# Patient Record
Sex: Female | Born: 1960 | Race: White | Hispanic: No | Marital: Married | Smoking: Never smoker
Health system: Southern US, Community
[De-identification: ages and names within clinical notes are randomized; demographics above are authoritative.]

## PROBLEM LIST (undated history)

## (undated) DIAGNOSIS — G2581 Restless legs syndrome: Secondary | ICD-10-CM

## (undated) DIAGNOSIS — G4700 Insomnia, unspecified: Secondary | ICD-10-CM

## (undated) DIAGNOSIS — E785 Hyperlipidemia, unspecified: Secondary | ICD-10-CM

## (undated) DIAGNOSIS — K219 Gastro-esophageal reflux disease without esophagitis: Secondary | ICD-10-CM

## (undated) DIAGNOSIS — E119 Type 2 diabetes mellitus without complications: Secondary | ICD-10-CM

## (undated) HISTORY — DX: Gastro-esophageal reflux disease without esophagitis: K21.9

## (undated) HISTORY — PX: APPENDECTOMY: SHX54

## (undated) HISTORY — DX: Insomnia, unspecified: G47.00

## (undated) HISTORY — DX: Type 2 diabetes mellitus without complications: E11.9

## (undated) HISTORY — DX: Hyperlipidemia, unspecified: E78.5

## (undated) HISTORY — DX: Restless legs syndrome: G25.81

---

## 1989-02-07 HISTORY — PX: TUBAL LIGATION: SHX77

## 1990-02-07 HISTORY — PX: ECTOPIC PREGNANCY SURGERY: SHX613

## 1997-02-07 HISTORY — PX: ABDOMINAL HYSTERECTOMY: SHX81

## 2002-02-07 HISTORY — PX: OTHER SURGICAL HISTORY: SHX169

## 2012-02-08 LAB — HM COLONOSCOPY

## 2016-04-18 ENCOUNTER — Encounter: Payer: Self-pay | Admitting: Family

## 2016-04-18 ENCOUNTER — Other Ambulatory Visit: Payer: Self-pay | Admitting: Family

## 2016-04-18 ENCOUNTER — Ambulatory Visit (INDEPENDENT_AMBULATORY_CARE_PROVIDER_SITE_OTHER): Payer: BC Managed Care – PPO | Admitting: Family

## 2016-04-18 VITALS — BP 136/87 | HR 92 | Temp 98.4°F | Resp 16 | Ht 63.5 in | Wt 235.4 lb

## 2016-04-18 DIAGNOSIS — Z111 Encounter for screening for respiratory tuberculosis: Secondary | ICD-10-CM | POA: Diagnosis not present

## 2016-04-18 DIAGNOSIS — E785 Hyperlipidemia, unspecified: Secondary | ICD-10-CM

## 2016-04-18 DIAGNOSIS — E118 Type 2 diabetes mellitus with unspecified complications: Secondary | ICD-10-CM | POA: Diagnosis not present

## 2016-04-18 DIAGNOSIS — Z794 Long term (current) use of insulin: Secondary | ICD-10-CM

## 2016-04-18 DIAGNOSIS — G4700 Insomnia, unspecified: Secondary | ICD-10-CM | POA: Insufficient documentation

## 2016-04-18 DIAGNOSIS — F32A Depression, unspecified: Secondary | ICD-10-CM | POA: Insufficient documentation

## 2016-04-18 DIAGNOSIS — E119 Type 2 diabetes mellitus without complications: Secondary | ICD-10-CM | POA: Diagnosis not present

## 2016-04-18 DIAGNOSIS — N3010 Interstitial cystitis (chronic) without hematuria: Secondary | ICD-10-CM

## 2016-04-18 DIAGNOSIS — E669 Obesity, unspecified: Secondary | ICD-10-CM | POA: Insufficient documentation

## 2016-04-18 DIAGNOSIS — F329 Major depressive disorder, single episode, unspecified: Secondary | ICD-10-CM

## 2016-04-18 DIAGNOSIS — R809 Proteinuria, unspecified: Secondary | ICD-10-CM | POA: Insufficient documentation

## 2016-04-18 DIAGNOSIS — E1169 Type 2 diabetes mellitus with other specified complication: Secondary | ICD-10-CM | POA: Insufficient documentation

## 2016-04-18 LAB — BASIC METABOLIC PANEL
BUN: 13 mg/dL (ref 6–23)
CALCIUM: 9.3 mg/dL (ref 8.4–10.5)
CHLORIDE: 105 meq/L (ref 96–112)
CO2: 29 mEq/L (ref 19–32)
CREATININE: 0.61 mg/dL (ref 0.40–1.20)
GFR: 108.44 mL/min (ref >60.00)
Glucose, Bld: 182 mg/dL — ABNORMAL HIGH (ref 70–99)
Potassium: 3.8 mEq/L (ref 3.5–5.1)
Sodium: 141 mEq/L (ref 135–145)

## 2016-04-18 LAB — HEMOGLOBIN A1C: HEMOGLOBIN A1C: 7.5 % — AB (ref 4.6–6.5)

## 2016-04-18 LAB — HEPATIC FUNCTION PANEL
ALT: 16 U/L (ref 0–35)
AST: 12 U/L (ref 0–37)
Albumin: 3.9 g/dL (ref 3.5–5.2)
Alkaline Phosphatase: 67 U/L (ref 39–117)
BILIRUBIN DIRECT: 0.1 mg/dL (ref 0.0–0.3)
BILIRUBIN TOTAL: 0.7 mg/dL (ref 0.2–1.2)
Total Protein: 7 g/dL (ref 6.0–8.3)

## 2016-04-18 LAB — MICROALBUMIN / CREATININE URINE RATIO
CREATININE, U: 107 mg/dL
MICROALB UR: 2.2 mg/dL — AB (ref 0.0–1.9)
MICROALB/CREAT RATIO: 2.1 mg/g (ref 0.0–30.0)

## 2016-04-18 LAB — LIPID PANEL
CHOLESTEROL: 213 mg/dL — AB (ref 0–200)
HDL: 51.5 mg/dL (ref >39.00)
LDL Cholesterol: 132 mg/dL — ABNORMAL HIGH (ref 0–99)
NonHDL: 161.05
TRIGLYCERIDES: 144 mg/dL (ref 0.0–149.0)
Total CHOL/HDL Ratio: 4
VLDL: 28.8 mg/dL (ref 0.0–40.0)

## 2016-04-18 MED ORDER — INSULIN DETEMIR 100 UNIT/ML FLEXPEN: 20.0000 [IU] | PEN_INJECTOR | SUBCUTANEOUS | 5 refills | Status: DC

## 2016-04-18 MED ORDER — ZALEPLON 10 MG PO CAPS: 10.0000 mg | ORAL_CAPSULE | Freq: Every evening | ORAL | 0 refills | Status: DC | PRN

## 2016-04-18 MED ORDER — INSULIN DETEMIR 100 UNIT/ML FLEXPEN: 23.0000 [IU] | PEN_INJECTOR | SUBCUTANEOUS | 5 refills | Status: DC

## 2016-04-18 MED ORDER — ASPIRIN 81 MG PO TABS: 81.0000 mg | ORAL_TABLET | Freq: Every day | ORAL | Status: DC

## 2016-04-18 MED ORDER — LISINOPRIL 5 MG PO TABS: 5.0000 mg | ORAL_TABLET | Freq: Every day | ORAL | 3 refills | Status: DC

## 2016-04-18 NOTE — Assessment & Plan Note (Signed)
Clinically stable. Consider referral to urology if recurrent symptoms.

## 2016-04-18 NOTE — Assessment & Plan Note (Signed)
Stable.  Not currently on medications.  Monitor.

## 2016-04-18 NOTE — Progress Notes (Signed)
Subjective:    Patient ID: Lisa [name], female    DOB: 25-Jan-1961, 56 y.o.   MRN: [id number]  HPI   Lisa [name] is a 56 yr old female who presents today to establish care.  She recently relocated from [place] for her husband's job.   pmhx is significant for the following:  DM2- maintained on levemir. She was first diagnosed 15 years ago.  She has been on insulin for 5 years. Last A1C in November was 7.0.  Reports that metformin caused GI issues so this was discontinued.  Reports that her diet is not good.  "I eat whatever."    Insomnia- reports that this has been an issue since she had her hysterectomy.  She reports that she takes sonata nightly.  She reports that she also uses melatonin otc sleep aids.  Reports that she has trouble falling asleep because maintained on prn sonata. Reports that she has claustrophobia.  Has declined sleep studies.  + snoring.   Interstitial cystitis- has hx of OAB as well.  Reports OAB meds were not helpful.   Depression- reports that she has been treated with "depression medication" in the past.  Reports that about 9 years ago with hospitalization x 3 days.  At that time she was having issues with her work and her son.  She was suicidal with plan at that time.    Review of Systems  Constitutional:       Some weight gain in the last 6 months, due to diet.  Has gained 15 pounds  HENT: Negative for hearing loss and rhinorrhea.   Eyes: Negative for visual disturbance.  Respiratory: Negative for cough.   Cardiovascular: Negative for leg swelling.  Gastrointestinal: Negative for constipation and diarrhea.  Genitourinary: Positive for frequency.  Musculoskeletal: Negative for myalgias.       Reports some mild right hip pain which she attributes to her weight.    Skin: Negative for rash.  Neurological:       Rare headaches  Hematological: Negative for adenopathy.  Psychiatric/Behavioral:       Denies depression/anxiety   Past Medical History:  Diagnosis  Date  . Diabetes mellitus without complication (HCC)    type II  . Insomnia      Social History   Social History  . Marital status: Married    Spouse name: N/A  . Number of children: N/A  . Years of education: N/A   Occupational History  . Not on file.   Social History Main Topics  . Smoking status: Never Smoker  . Smokeless tobacco: Never Used  . Alcohol use Yes     Comment: special occasions "couple times a year"  . Drug use: No  . Sexual activity: Yes   Other Topics Concern  . Not on file   Social History Narrative   Works as a Systems developerspecial Ed Teacher for [employer]   Moved from [place], grew up in [place]   Married   Grown children (2 sons one in [place] he has 2 sons and a daughter, another son in [place] and has one child)    Past Surgical History:  Procedure Laterality Date  . ABDOMINAL HYSTERECTOMY  1999   adhesions  . APPENDECTOMY    . CESAREAN SECTION  1987  . ECTOPIC PREGNANCY SURGERY  1992  . OTHER SURGICAL HISTORY  2004   "bowel obstruction-- open wound"  . TUBAL LIGATION  1991    Family History  Problem Relation Age of Onset  .  Diabetes Mother     type II  . Hyperthyroidism Mother   . Diabetes Father     type II  . Diabetes Paternal Grandmother     No Known Allergies  No current outpatient prescriptions on file prior to visit.   No current facility-administered medications on file prior to visit.     BP 136/87 (BP Location: Right Arm, Cuff Size: Large)   Pulse 92   Temp 98.4 F (36.9 C) (Oral)   Resp 16   Ht 5' 3.5" (1.613 m)   Wt 235 lb 6.4 oz (106.8 kg)   SpO2 98% Comment: room air  BMI 41.05 kg/m       Objective:   Physical Exam  Constitutional: She is oriented to person, place, and time. She appears well-developed and well-nourished.  HENT:  Head: Normocephalic and atraumatic.  Cardiovascular: Normal rate, regular rhythm and normal heart sounds.   No murmur heard. Pulmonary/Chest: Effort normal and breath sounds normal.  No respiratory distress. She has no wheezes.  Musculoskeletal: She exhibits no edema.  Neurological: She is alert and oriented to person, place, and time.  Psychiatric: She has a normal mood and affect. Her behavior is normal. Judgment and thought content normal.          Assessment & Plan:  Add aspirin 81mg  once daily for cardiac protection.   Tdap today and obtain TB gold testing for her paperwork requirements for work.

## 2016-04-18 NOTE — Telephone Encounter (Signed)
Please contact patient and let her know that her A1C is above goal at 7.5.  I would recommend that she increase her levemir from 20 to 23 units once daily. Also, cholesterol above goal. Add lipitor once daily.  Urine microalbumin is elevated so restarting lisinopril as we discussed is important.

## 2016-04-18 NOTE — Progress Notes (Signed)
Pre visit review using our clinic review tool, if applicable. No additional management support is needed unless otherwise documented below in the visit note. 

## 2016-04-18 NOTE — Patient Instructions (Addendum)
Please complete lab work prior to leaving. Add lisinopril 5mg  once daily for kidney protection and aspirin 81mg  once daily for cardiac protection. Work on adding regular exercise and a healthier diet.

## 2016-04-18 NOTE — Assessment & Plan Note (Signed)
Continue sonata.  A controlled substance contract is signed and a UDS will be obtained. Refill provided.

## 2016-04-18 NOTE — Assessment & Plan Note (Signed)
Last A1C was 7. Obtain follow up A1C, urine microalbumin.  Add ace for renal protection and BP control.  BP goal <130/80.

## 2016-04-19 MED ORDER — INSULIN DETEMIR 100 UNIT/ML FLEXPEN: 23.0000 [IU] | PEN_INJECTOR | SUBCUTANEOUS | 5 refills | Status: DC

## 2016-04-19 MED ORDER — ATORVASTATIN CALCIUM 20 MG PO TABS: 20.0000 mg | ORAL_TABLET | Freq: Every day | ORAL | 3 refills | Status: DC

## 2016-04-19 NOTE — Telephone Encounter (Signed)
Notified pt and she voices understanding. Rx signed for atorvastatin and Rx sent with new insulin dose per pt's request.

## 2016-04-20 LAB — QUANTIFERON TB GOLD ASSAY (BLOOD)
Interferon Gamma Release Assay: NEGATIVE
MITOGEN-NIL SO: 6.45 [IU]/mL
Quantiferon Nil Value: 0.01 IU/mL
Quantiferon Tb Ag Minus Nil Value: 0 IU/mL

## 2016-04-25 ENCOUNTER — Telehealth: Payer: Self-pay | Admitting: *Deleted

## 2016-04-25 NOTE — Telephone Encounter (Signed)
Pt states insurance will be changing soon and Levemir will cost her $100. Pt is requesting different insulin be sent to pharmacy. Advised her to call her new insurance and find out preferred alternatives and let us know choices then we can send appropriate alternative. Pt voices understanding.

## 2016-04-25 NOTE — Telephone Encounter (Signed)
Health exam certificate was completed. Notified pt and she requests that we mail original form to her home address. Copy sent for scanning and original mailed.

## 2016-05-16 ENCOUNTER — Encounter: Payer: Self-pay | Admitting: Family

## 2016-05-16 ENCOUNTER — Ambulatory Visit (INDEPENDENT_AMBULATORY_CARE_PROVIDER_SITE_OTHER): Payer: BC Managed Care – PPO | Admitting: Family

## 2016-05-16 VITALS — BP 129/76 | HR 78 | Temp 98.2°F | Resp 16 | Ht 63.5 in | Wt 235.6 lb

## 2016-05-16 DIAGNOSIS — Z0000 Encounter for general adult medical examination without abnormal findings: Secondary | ICD-10-CM | POA: Diagnosis not present

## 2016-05-16 DIAGNOSIS — E118 Type 2 diabetes mellitus with unspecified complications: Secondary | ICD-10-CM

## 2016-05-16 DIAGNOSIS — Z794 Long term (current) use of insulin: Secondary | ICD-10-CM

## 2016-05-16 MED ORDER — VITAMIN D3 75 MCG (3000 UT) PO TABS: 1.0000 | ORAL_TABLET | Freq: Every day | ORAL | Status: DC

## 2016-05-16 MED ORDER — AMOXICILLIN 500 MG PO CAPS: 500.0000 mg | ORAL_CAPSULE | Freq: Three times a day (TID) | ORAL | 0 refills | Status: DC

## 2016-05-16 MED ORDER — FLUCONAZOLE 150 MG PO TABS: 150.0000 mg | ORAL_TABLET | Freq: Once | ORAL | 0 refills | Status: AC

## 2016-05-16 NOTE — Patient Instructions (Signed)
Please work on healthy diet/exercise/weight loss

## 2016-05-16 NOTE — Progress Notes (Signed)
Subjective:    Patient ID: Lisa [name], female    DOB: Sep 28, 1960, 56 y.o.   MRN: [id number]  HPI  Patient presents today for complete physical.  Immunizations: tetanus up to date Diet: not eating healthy (has not scheduled nutrition visit) Exercise: no formal exercise.  Colonoscopy: normal per patient Dexa: has had bone density which was normal per pt.  Pap Smear: unsure, thinks maybe 2016 Mammogram:10/16     Review of Systems  Constitutional: Positive for unexpected weight change.       She reports that she has gained 10-15 pounds in the last 6 months due to stress/moving  HENT: Positive for rhinorrhea. Negative for hearing loss.   Eyes: Negative for visual disturbance.  Respiratory: Negative for cough and shortness of breath.   Cardiovascular: Negative for chest pain and leg swelling.  Gastrointestinal: Negative for constipation and diarrhea.  Genitourinary: Negative for dysuria, frequency and hematuria.       Some stress incontinence.  Reports neg work up for microscopic hematuria with urology  Musculoskeletal: Negative for myalgias.       Some arthritis pain in hands and hips  Skin: Negative for rash.  Neurological:       Denies recent HA's  Hematological:       Reports mild LAD due to mild sore throat  Psychiatric/Behavioral:       Denies depression/anxiety   Past Medical History:  Diagnosis Date  . Diabetes mellitus without complication (HCC)    type II  . Insomnia      Social History   Social History  . Marital status: Married    Spouse name: N/A  . Number of children: N/A  . Years of education: N/A   Occupational History  . Not on file.   Social History Main Topics  . Smoking status: Never Smoker  . Smokeless tobacco: Never Used  . Alcohol use Yes     Comment: special occasions "couple times a year"  . Drug use: No  . Sexual activity: Yes   Other Topics Concern  . 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 ABDOMINAL HYSTERECTOMY  1999   adhesions  . APPENDECTOMY    . CESAREAN SECTION  1987  . ECTOPIC PREGNANCY SURGERY  1992  . OTHER SURGICAL HISTORY  2004   "bowel obstruction-- open wound"  . TUBAL LIGATION  1991    Family History  Problem Relation Age of Onset  . Diabetes Mother     type II  . Hyperthyroidism Mother   . Diabetes Father     type II  . Diabetes Paternal Grandmother     No Known Allergies  Current Outpatient Prescriptions on File Prior to Visit  Medication Sig Dispense Refill  . aspirin 81 MG tablet Take 1 tablet (81 mg total) by mouth daily. 30 tablet   . atorvastatin (LIPITOR) 20 MG tablet Take 1 tablet (20 mg total) by mouth daily. 30 tablet 3  . Insulin Detemir (LEVEMIR FLEXTOUCH) 100 UNIT/ML Pen Inject 23 Units into the skin every morning. 15 mL 5  . lisinopril (PRINIVIL,ZESTRIL) 5 MG tablet Take 1 tablet (5 mg total) by mouth daily.  30 tablet 3  . zaleplon (SONATA) 10 MG capsule Take 1 capsule (10 mg total) by mouth at bedtime as needed for sleep. 30 capsule 0   No current facility-administered medications on file prior to visit.     Pulse 78   Temp 98.2 F (36.8 C) (Oral)   Resp 16   Ht 5' 3.5" (1.613 m)   Wt 235 lb 9.6 oz (106.9 kg)   SpO2 98% Comment: room air  BMI 41.08 kg/m        Objective:   Physical Exam  Physical Exam  Constitutional: She is oriented to person, place, and time. She appears well-developed and well-nourished. No distress.  HENT:  Head: Normocephalic and atraumatic.  Right Ear: Tympanic membrane and ear canal normal.  Left Ear: Tympanic membrane and ear canal normal.  Mouth/Throat: Oropharynx is clear and moist.  Eyes: Pupils are equal, round, and reactive to light. No scleral  icterus.  Neck: Normal range of motion. No thyromegaly present.  Cardiovascular: Normal rate and regular rhythm.   No murmur heard. Pulmonary/Chest: Effort normal and breath sounds normal. No respiratory distress. He has no wheezes. She has no rales. She exhibits no tenderness.  Abdominal: Soft. Bowel sounds are normal. She exhibits no distension and no mass. There is no tenderness. There is no rebound and no guarding.  Musculoskeletal: She exhibits no edema.  Lymphadenopathy:    She has no cervical adenopathy.  Neurological: She is alert and oriented to person, place, and time. She has normal patellar reflexes. She exhibits normal muscle tone. Coordination normal.  Skin: Skin is warm and dry.  Psychiatric: She has a normal mood and affect. Her behavior is normal. Judgment and thought content normal.  Breasts: Examined lying Right: Without masses, retractions, discharge or axillary adenopathy.  Left: Without masses, retractions, discharge or axillary adenopathy.          Assessment & Plan:          Assessment & Plan:  Preventative Care- discussed healthy diet, exercise and weight loss. Tetanus is up to date. Declines additional lab testing. EKG notes [facility] in V1 and V2, will request old EKG for comparison.  See phone note. Will obtain routine lab work.

## 2016-05-16 NOTE — Progress Notes (Signed)
Pre visit review using our clinic review tool, if applicable. No additional management support is needed unless otherwise documented below in the visit note. 

## 2016-05-19 ENCOUNTER — Telehealth: Payer: Self-pay | Admitting: Family

## 2016-05-19 NOTE — Telephone Encounter (Signed)
EKG mildly abnormal- but may be normal for her.  Can we please request old EKG/records for comparison if we have not already?

## 2016-05-24 ENCOUNTER — Telehealth: Payer: Self-pay | Admitting: Family

## 2016-05-24 NOTE — Telephone Encounter (Signed)
Pt returned my call and was notified of below. She will stop by today to sign records release. Release placed at front desk for pt signature.

## 2016-05-24 NOTE — Telephone Encounter (Signed)
Left message for pt to return my call.

## 2016-05-24 NOTE — Telephone Encounter (Signed)
Pt came by signed a MR Release for [employer] [facility] and one for [facility] in [place]. Pt also advised if she needs to have another EKG done it will need to an appt after 3:30 pm any day.

## 2016-05-25 NOTE — Telephone Encounter (Signed)
Pt signed releases on 05/24/16 and they have been faxed. Awaiting records.

## 2016-05-25 NOTE — Telephone Encounter (Signed)
Releases were faxed on 05/24/16. See ROI scanned into chart. Awaiting record.

## 2016-05-31 NOTE — Telephone Encounter (Signed)
Lisa [name]-- FYI in case you see this come through.

## 2016-06-28 ENCOUNTER — Ambulatory Visit: Payer: BC Managed Care – PPO | Admitting: Medical

## 2016-08-24 ENCOUNTER — Ambulatory Visit: Payer: BC Managed Care – PPO | Admitting: Family

## 2016-12-08 ENCOUNTER — Ambulatory Visit (INDEPENDENT_AMBULATORY_CARE_PROVIDER_SITE_OTHER): Payer: BC Managed Care – PPO | Admitting: Family Medicine

## 2016-12-08 ENCOUNTER — Encounter: Payer: Self-pay | Admitting: Family Medicine

## 2016-12-08 VITALS — BP 132/80 | HR 80 | Temp 98.2°F | Ht 63.5 in | Wt 234.4 lb

## 2016-12-08 DIAGNOSIS — E119 Type 2 diabetes mellitus without complications: Secondary | ICD-10-CM | POA: Diagnosis not present

## 2016-12-08 DIAGNOSIS — G4700 Insomnia, unspecified: Secondary | ICD-10-CM

## 2016-12-08 DIAGNOSIS — M26629 Arthralgia of temporomandibular joint, unspecified side: Secondary | ICD-10-CM

## 2016-12-08 DIAGNOSIS — Z794 Long term (current) use of insulin: Secondary | ICD-10-CM | POA: Diagnosis not present

## 2016-12-08 LAB — HEMOGLOBIN A1C: Hgb A1c MFr Bld: 9.4 % — ABNORMAL HIGH (ref 4.6–6.5)

## 2016-12-08 MED ORDER — CYCLOBENZAPRINE HCL 10 MG PO TABS: 10.0000 mg | ORAL_TABLET | Freq: Three times a day (TID) | ORAL | 0 refills | Status: DC | PRN

## 2016-12-08 MED ORDER — DOXEPIN HCL 10 MG PO CAPS: 10.0000 mg | ORAL_CAPSULE | Freq: Every evening | ORAL | 2 refills | Status: DC | PRN

## 2016-12-08 MED ORDER — NAPROXEN 500 MG PO TBEC: 500.0000 mg | DELAYED_RELEASE_TABLET | Freq: Two times a day (BID) | ORAL | 0 refills | Status: DC

## 2016-12-08 MED ORDER — DAPAGLIFLOZIN PROPANEDIOL 5 MG PO TABS: 5.0000 mg | ORAL_TABLET | Freq: Every day | ORAL | 2 refills | Status: DC

## 2016-12-08 NOTE — Progress Notes (Signed)
Pre visit review using our clinic review tool, if applicable. No additional management support is needed unless otherwise documented below in the visit note. 

## 2016-12-08 NOTE — Patient Instructions (Addendum)
Sleep is important to us all. Getting good sleep is imperative to adequate functioning during the day. Work with our counselors who are trained to help people obtain quality sleep. Call [phone] to schedule an appointment or if you are curious about insurance coverage/cost.  Sleep Hygiene Tips:  Do not watch TV or look at screens within 1 hour of going to bed. If you do, make sure there is a blue light filter (nighttime mode) involved.  Try to go to bed around the same time every night. Wake up at the same time within 1 hour of regular time. Ex: If you wake up at 7 AM for work, do not sleep past 8 AM on days that you don't work.  Do not drink alcohol before bedtime.  Do not consume caffeine-containing beverages after noon or within 9 hours of intended bedtime.  Get regular exercise/physical activity in your life, but not within 2 hours of planned bedtime.  Do not take naps.   Do not eat within 2 hours of planned bedtime.  Melatonin, 3-5 mg 30-60 minutes before planned bedtime may be helpful.   The bed should be for sleep or sex only. If after 20-30 minutes you are unable to fall asleep, get up and do something relaxing. Do this until you feel ready to go to sleep again.   Ok to finish insulin. We are changing because of cost.

## 2016-12-08 NOTE — Progress Notes (Signed)
Chief Complaint  Patient presents with  . Sinusitis  . Ear Pain    Lisa [name] here for URI complaints.  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 She is not able to afford $100 per mo insulin, wondering if any other options. She is unable to tolerate all forms of Metformin due to GI side effects.   Hx of insomina, was on Ambien before Sonata. Lisa [name] encouarged her to wean down, using OTC sleep med and Melatonin with some relief. She has had CBT in the past without relief.   ROS:  Const: Denies fevers HEENT: As noted in HPI Lungs: No SOB  Past Medical History:  Diagnosis Date  . Diabetes mellitus without complication (HCC)    type II  . Insomnia    Family History  Problem Relation Age of Onset  . Diabetes Mother        type II  . Hyperthyroidism Mother   . Diabetes Father        type II  . Diabetes Paternal Grandmother     BP 132/80 (BP Location: Left Arm, Patient Position: Sitting, Cuff Size: Large)   Pulse 80   Temp 98.2 F (36.8 C) (Oral)   Ht 5' 3.5" (1.613 m)   Wt 234 lb 6 oz (106.3 kg)   SpO2 97%   BMI 40.87 kg/m  General: Awake, alert, appears stated age HEENT: AT, [place], ears patent b/l and TM's neg, nares patent w/o discharge, sinuses are not tender to palpation, pharynx pink and without exudates, MMM Neck: No masses or asymmetry Heart: RRR, no murmurs, no bruits Lungs: CTAB, no accessory muscle use MSK: TTP over R TMJ, no catching or locking during movement Psych: Age appropriate judgment and insight, normal mood and affect  TMJ syndrome - Plan: cyclobenzaprine (FLEXERIL) 10 MG tablet, naproxen (EC NAPROSYN) 500 MG EC tablet  Controlled type 2 diabetes  mellitus without complication, with long-term current use of insulin (HCC) - Plan: dapagliflozin propanediol (FARXIGA) 5 MG TABS tablet, Hemoglobin A1c  Insomnia, unspecified type - Plan: doxepin (SINEQUAN) 10 MG capsule  Orders as above.  TMJ most likely given normal appearance of TM's and jaw pain.  Change insulin to SGLT-2, payment card given.  F/u prn. If starting to experience fevers, shaking, or shortness of breath, seek immediate care. Pt voiced understanding and agreement to the plan.  [name], DO 12/08/16 2:01 PM

## 2017-01-06 ENCOUNTER — Ambulatory Visit: Payer: BC Managed Care – PPO | Admitting: Family Medicine

## 2017-03-13 ENCOUNTER — Encounter: Payer: Self-pay | Admitting: Family Medicine

## 2017-03-13 ENCOUNTER — Ambulatory Visit: Payer: BC Managed Care – PPO | Admitting: Family Medicine

## 2017-03-13 VITALS — BP 121/82 | HR 88 | Temp 98.2°F | Resp 16 | Ht 63.5 in | Wt 231.0 lb

## 2017-03-13 DIAGNOSIS — E669 Obesity, unspecified: Secondary | ICD-10-CM | POA: Diagnosis not present

## 2017-03-13 DIAGNOSIS — G8929 Other chronic pain: Secondary | ICD-10-CM

## 2017-03-13 DIAGNOSIS — E1169 Type 2 diabetes mellitus with other specified complication: Secondary | ICD-10-CM | POA: Diagnosis not present

## 2017-03-13 DIAGNOSIS — E119 Type 2 diabetes mellitus without complications: Secondary | ICD-10-CM

## 2017-03-13 DIAGNOSIS — Z23 Encounter for immunization: Secondary | ICD-10-CM | POA: Diagnosis not present

## 2017-03-13 DIAGNOSIS — F411 Generalized anxiety disorder: Secondary | ICD-10-CM

## 2017-03-13 DIAGNOSIS — M25562 Pain in left knee: Secondary | ICD-10-CM | POA: Diagnosis not present

## 2017-03-13 DIAGNOSIS — R32 Unspecified urinary incontinence: Secondary | ICD-10-CM | POA: Diagnosis not present

## 2017-03-13 MED ORDER — SERTRALINE HCL 50 MG PO TABS: 50.0000 mg | ORAL_TABLET | Freq: Every day | ORAL | 3 refills | Status: DC

## 2017-03-13 MED ORDER — ATORVASTATIN CALCIUM 40 MG PO TABS: 40.0000 mg | ORAL_TABLET | Freq: Every day | ORAL | 3 refills | Status: DC

## 2017-03-13 MED ORDER — INSULIN DETEMIR 100 UNIT/ML FLEXPEN: 23.0000 [IU] | PEN_INJECTOR | SUBCUTANEOUS | 5 refills | Status: DC

## 2017-03-13 NOTE — Progress Notes (Signed)
Subjective:   Chief Complaint  Patient presents with  . Diabetes    Here for follow up    Lisa [name] is a 57 y.o. female here for follow-up of diabetes.   Lisa [name]'s self monitored glucose range is mid 100's.   Patient denies hypoglycemic reactions. Patient does require insulin- Levemir 23 u/day. Medications include: Farxiga 5 mg/d; GI AE's with Metformin.  Her insulin was quite expensive at the last visit, however when she came off of it, her blood sugars increased drastically. Patient exercises 0 days per week on average.   Diet is poor. Statin? Yes ACEi/ARB? Yes; she is on lisinopril for a history of proteinuria  Poor sleep Still having difficulty sleeping. Racing thoughts, anxiety. Has been on meds in past, did well on Zoloft.  She was recently started on doxepin and that was not particularly helpful.  She has been in cognitive behavioral therapy in the past and did not notice a benefit.  She is not currently following with a counselor or psychologist.  She complains of chronic left knee pain.  She has been using ibuprofen before bed.  Has been helpful, otherwise it will wake her up at night.  She has a history of an injury to her leg.  It is worse when she goes upstairs.  Intermittent swelling.  She has never had any imaging of her knee.  She has a history of urinary incontinence that is been getting worse recently.  She would like a UA today.  No pain, bleeding, discharge.  She is to see a urologist but is not currently seeing one.  Past Medical History:  Diagnosis Date  . Diabetes mellitus without complication (HCC)    type II  . Insomnia     Past Surgical History:  Procedure Laterality Date  . ABDOMINAL HYSTERECTOMY  1999   adhesions  . APPENDECTOMY    . CESAREAN SECTION  1987  . ECTOPIC PREGNANCY SURGERY  1992  . OTHER SURGICAL HISTORY  2004   "bowel obstruction-- open wound"  . TUBAL LIGATION  1991    Social History   Socioeconomic History  . Marital status:  Married  Tobacco Use  . Smoking status: Never Smoker  . Smokeless tobacco: Never Used  Substance and Sexual Activity  . Alcohol use: Yes    Comment: special occasions "couple times a year"  . Drug use: No  . 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 Cholecalciferol (VITAMIN D3) 3000 units TABS Take 1 tablet by mouth daily. 30 tablet   . dapagliflozin propanediol (FARXIGA) 5 MG TABS tablet Take 5 mg by mouth daily. 30 tablet 2  . lisinopril (PRINIVIL,ZESTRIL) 5 MG tablet Take 1 tablet (5 mg total) by mouth daily. 30 tablet 3    Related testing: Foot exam(monofilament and inspection):done Retinal exam:done Date of retinal exam: 06/2016  Done by:  Dr. Hazle Quant Pneumovax: done today Flu Shot: Received 10/18  Review of Systems: Eye:  No recent significant change in vision Pulmonary:  No SOB Cardiovascular:  No chest pain, no palpitations Skin/Integumentary ROS:  No abnormal skin lesions reported Neurologic:  No numbness, tingling  Objective:  BP 121/82 (BP Location: Right Arm, Patient Position: Sitting, Cuff Size: Large)   Pulse 88   Temp 98.2 F (36.8 C) (Oral)   Resp 16   Ht 5' 3.5" (1.613 m)   Wt 231 lb (104.8 kg)   SpO2 99%   BMI 40.28 kg/m  General:  Well developed, well nourished, in no apparent distress Skin:  Warm, no pallor or diaphoresis Head:  Normocephalic, atraumatic Eyes:  Pupils equal and round, sclera anicteric without injection  Nose:  External nares without trauma, no discharge Throat/Pharynx:  Lips and gingiva without lesion Neck: Neck supple.  No obvious thyromegaly or masses.  No bruits Lungs:  clear to auscultation, breath sounds equal bilaterally,  no wheezes, rales, or stridor Cardio:  regular rate and rhythm without murmurs, no bruits, no LE edema Musculoskeletal:  Symmetrical muscle groups noted without atrophy or deformity Neuro:  Sensation intact to pinprick on feet Psych: Age appropriate judgment and insight  Assessment:   Diabetes mellitus type 2 in obese (HCC) - Plan: Hemoglobin A1c, atorvastatin (LIPITOR) 40 MG tablet, Lipid panel, Comprehensive metabolic panel, HM Diabetes Foot Exam, Insulin Detemir (LEVEMIR FLEXTOUCH) 100 UNIT/ML Pen  GAD (generalized anxiety disorder) - Plan: sertraline (ZOLOFT) 50 MG tablet  Need for 23-polyvalent pneumococcal polysaccharide vaccine - Plan: Pneumococcal polysaccharide vaccine 23-valent greater than or equal to 2yo subcutaneous/IM  Urinary incontinence, unspecified type - Plan: Urinalysis  Chronic pain of left knee   Plan:   Orders as above.  Reorder insulin.  Foot exam is normal.  Increase dose of Lipitor from 20 mg daily to 40 mg daily.  Check labs.  Hold off on adding back first week until we see the A1c. Insomnia likely secondary to anxiety.  Restart Zoloft, 25 mg daily for the first 2 weeks then increase to 50 mg daily. Counseled on diet and exercise. Check UA per request of patient.  If normal, will offer referral to urology. For her knee, continue as needed anti-inflammatories.  Ice.  Activity as tolerated.  If still bothersome, we discussed getting imaging and possible injections.  Physical therapy is also an option. F/u in 6 weeks to recheck anxiety. The patient voiced understanding and agreement to the plan.  Greater than  40 minutes were spent face to face with the patient with greater than 50% of this time spent counseling on diet/exercise, anxiety, diabetes management, knee pain, immunizations, and urinary issues.   Jilda Rocheicholas Paul [place], DO 03/13/17 4:49 PM

## 2017-03-13 NOTE — Patient Instructions (Addendum)
Keep the diet clean.  Aim to do some physical exertion for 150 minutes per week. This is typically divided into 5 days per week, 30 minutes per day. The activity should be enough to get your heart rate up. Anything is better than nothing if you have time constraints.  Please consider counseling. Contact [phone] to schedule an appointment or inquire about cost/insurance coverage.  We do large joint (knee, shoulder, etc) here if we decide to pursue further treatment for your pain.   Hold off on [place] for now. I will let you know via MyChart if we need to go back on it.   Let us know if you need anything.  Healthy Eating Plan Many factors influence your heart health, including eating and exercise habits. Heart (coronary) risk increases with abnormal blood fat (lipid) levels. Heart-healthy meal planning includes limiting unhealthy fats, increasing healthy fats, and making other small dietary changes. This includes maintaining a healthy body weight to help keep lipid levels within a normal range.  WHAT IS MY PLAN?  Your health care provider recommends that you:  Drink a glass of water before meals to help with satiety.  Eat slowly.  An alternative to the water is to add Metamucil. This will help with satiety as well. It does contain calories, unlike water.  WHAT TYPES OF FAT SHOULD I CHOOSE?  Choose healthy fats more often. Choose monounsaturated and polyunsaturated fats, such as olive oil and canola oil, flaxseeds, walnuts, almonds, and seeds.  Eat more omega-3 fats. Good choices include salmon, mackerel, sardines, tuna, flaxseed oil, and ground flaxseeds. Aim to eat fish at least two times each week.  Avoid foods with partially hydrogenated oils in them. These contain trans fats. Examples of foods that contain trans fats are stick margarine, some tub margarines, cookies, crackers, and other baked goods. If you are going to avoid a fat, this is the one to avoid!  WHAT GENERAL  GUIDELINES DO I NEED TO FOLLOW?  Check food labels carefully to identify foods with trans fats. Avoid these types of options when possible.  Fill one half of your plate with vegetables and green salads. Eat 4-5 servings of vegetables per day. A serving of vegetables equals 1 cup of raw leafy vegetables,  cup of raw or cooked cut-up vegetables, or  cup of vegetable juice.  Fill one fourth of your plate with whole grains. Look for the word "whole" as the first word in the ingredient list.  Fill one fourth of your plate with lean protein foods.  Eat 4-5 servings of fruit per day. A serving of fruit equals one medium whole fruit,  cup of dried fruit,  cup of fresh, frozen, or canned fruit. Try to avoid fruits in cups/syrups as the sugar content can be high.  Eat more foods that contain soluble fiber. Examples of foods that contain this type of fiber are apples, broccoli, carrots, beans, peas, and barley. Aim to get 20-30 g of fiber per day.  Eat more home-cooked food and less restaurant, buffet, and fast food.  Limit or avoid alcohol.  Limit foods that are high in starch and sugar.  Avoid fried foods when able.  Cook foods by using methods other than frying. Baking, boiling, grilling, and broiling are all great options. Other fat-reducing suggestions include: ? Removing the skin from poultry. ? Removing all visible fats from meats. ? Skimming the fat off of stews, soups, and gravies before serving them. ? Steaming vegetables in water or broth.  Lose weight if you are overweight. Losing just 5-10% of your initial body weight can help your overall health and prevent diseases such as diabetes and heart disease.  Increase your consumption of nuts, legumes, and seeds to 4-5 servings per week. One serving of dried beans or legumes equals  cup after being cooked, one serving of nuts equals 1 ounces, and one serving of seeds equals  ounce or 1 tablespoon.  WHAT ARE GOOD FOODS CAN I  EAT? Grains Grainy breads (try to find bread that is 3 g of fiber per slice or greater), oatmeal, light popcorn. Whole-grain cereals. Rice and pasta, including brown rice and those that are made with whole wheat. Edamame pasta is a great alternative to grain pasta. It has a higher protein content. Try to avoid significant consumption of white bread, sugary cereals, or pastries/baked goods.  Vegetables All vegetables. Cooked white potatoes do not count as vegetables.  Fruits All fruits, but limit pineapple and bananas as these fruits have a higher sugar content.  Meats and Other Protein Sources Lean, well-trimmed beef, veal, pork, and lamb. Chicken and [place] without skin. All fish and shellfish. Wild duck, rabbit, pheasant, and venison. Egg whites or low-cholesterol egg substitutes. Dried beans, peas, lentils, and tofu.Seeds and most nuts.  Dairy Low-fat or nonfat cheeses, including ricotta, string, and mozzarella. Skim or 1% milk that is liquid, powdered, or evaporated. Buttermilk that is made with low-fat milk. Nonfat or low-fat yogurt. Soy/Almond milk are good alternatives if you cannot handle dairy.  Beverages Water is the best for you. Sports drinks with less sugar are more desirable unless you are a highly active athlete.  Sweets and Desserts Sherbets and fruit ices. Honey, jam, marmalade, jelly, and syrups. Dark chocolate.  Eat all sweets and desserts in moderation.  Fats and Oils Nonhydrogenated (trans-free) margarines. Vegetable oils, including soybean, sesame, sunflower, olive, peanut, safflower, corn, canola, and cottonseed. Salad dressings or mayonnaise that are made with a vegetable oil. Limit added fats and oils that you use for cooking, baking, salads, and as spreads.  Other Cocoa powder. Coffee and tea. Most condiments.  The items listed above may not be a complete list of recommended foods or beverages. Contact your dietitian for more options.

## 2017-03-14 LAB — COMPREHENSIVE METABOLIC PANEL
ALBUMIN: 4.2 g/dL (ref 3.5–5.2)
ALT: 22 U/L (ref 0–35)
AST: 19 U/L (ref 0–37)
Alkaline Phosphatase: 78 U/L (ref 39–117)
BUN: 11 mg/dL (ref 6–23)
CHLORIDE: 101 meq/L (ref 96–112)
CO2: 29 meq/L (ref 19–32)
Calcium: 9.4 mg/dL (ref 8.4–10.5)
Creatinine, Ser: 0.64 mg/dL (ref 0.40–1.20)
GFR: 102.25 mL/min (ref >60.00)
GLUCOSE: 192 mg/dL — AB (ref 70–99)
POTASSIUM: 4.3 meq/L (ref 3.5–5.1)
SODIUM: 137 meq/L (ref 135–145)
Total Bilirubin: 0.6 mg/dL (ref 0.2–1.2)
Total Protein: 7.6 g/dL (ref 6.0–8.3)

## 2017-03-14 LAB — LIPID PANEL
CHOL/HDL RATIO: 4
Cholesterol: 227 mg/dL — ABNORMAL HIGH (ref 0–200)
HDL: 55.6 mg/dL (ref >39.00)
LDL CALC: 147 mg/dL — AB (ref 0–99)
NONHDL: 171.63
Triglycerides: 122 mg/dL (ref 0.0–149.0)
VLDL: 24.4 mg/dL (ref 0.0–40.0)

## 2017-03-14 LAB — URINALYSIS, ROUTINE W REFLEX MICROSCOPIC
Bilirubin Urine: NEGATIVE
KETONES UR: NEGATIVE
Leukocytes, UA: NEGATIVE
NITRITE: NEGATIVE
Specific Gravity, Urine: 1.025 (ref 1.000–1.030)
Total Protein, Urine: NEGATIVE
Urine Glucose: >=1000 — AB
Urobilinogen, UA: 0.2 (ref 0.0–1.0)
pH: 5.5 (ref 5.0–8.0)

## 2017-03-14 LAB — HEMOGLOBIN A1C: HEMOGLOBIN A1C: 11.2 % — AB (ref 4.6–6.5)

## 2017-03-22 ENCOUNTER — Encounter: Payer: Self-pay | Admitting: Family Medicine

## 2017-03-22 ENCOUNTER — Other Ambulatory Visit: Payer: Self-pay | Admitting: Family Medicine

## 2017-03-22 MED ORDER — DAPAGLIFLOZIN PROPANEDIOL 10 MG PO TABS: 10.0000 mg | ORAL_TABLET | Freq: Every day | ORAL | 2 refills | Status: DC

## 2017-04-10 ENCOUNTER — Encounter: Payer: Self-pay | Admitting: Family Medicine

## 2017-04-10 ENCOUNTER — Ambulatory Visit (HOSPITAL_BASED_OUTPATIENT_CLINIC_OR_DEPARTMENT_OTHER)
Admission: RE | Admit: 2017-04-10 | Discharge: 2017-04-10 | Disposition: A | Discharge status: Home / Self Care | Payer: BC Managed Care – PPO | Source: Ambulatory Visit | Attending: Family Medicine | Admitting: Family Medicine

## 2017-04-10 ENCOUNTER — Ambulatory Visit: Payer: BC Managed Care – PPO | Admitting: Family Medicine

## 2017-04-10 VITALS — BP 122/82 | HR 76 | Temp 98.2°F | Ht 63.0 in | Wt 227.4 lb

## 2017-04-10 DIAGNOSIS — F411 Generalized anxiety disorder: Secondary | ICD-10-CM

## 2017-04-10 DIAGNOSIS — E669 Obesity, unspecified: Secondary | ICD-10-CM

## 2017-04-10 DIAGNOSIS — G8929 Other chronic pain: Secondary | ICD-10-CM | POA: Diagnosis not present

## 2017-04-10 DIAGNOSIS — E1169 Type 2 diabetes mellitus with other specified complication: Secondary | ICD-10-CM | POA: Diagnosis not present

## 2017-04-10 DIAGNOSIS — M25562 Pain in left knee: Secondary | ICD-10-CM | POA: Insufficient documentation

## 2017-04-10 MED ORDER — SERTRALINE HCL 50 MG PO TABS: 50.0000 mg | ORAL_TABLET | Freq: Every day | ORAL | 3 refills | Status: DC

## 2017-04-10 MED ORDER — INSULIN DETEMIR 100 UNIT/ML FLEXPEN
23.0000 [IU] | PEN_INJECTOR | SUBCUTANEOUS | 5 refills | Status: DC
Start: 2017-04-10 — End: 2017-04-14

## 2017-04-10 MED ORDER — DAPAGLIFLOZIN PROPANEDIOL 10 MG PO TABS: 10.0000 mg | ORAL_TABLET | Freq: Every day | ORAL | 2 refills | Status: DC

## 2017-04-10 MED ORDER — DICLOFENAC SODIUM 2 % TD SOLN: TRANSDERMAL | 1 refills | Status: DC

## 2017-04-10 NOTE — Progress Notes (Signed)
Pre visit review using our clinic review tool, if applicable. No additional management support is needed unless otherwise documented below in the visit note. 

## 2017-04-10 NOTE — Patient Instructions (Signed)
Ibuprofen 400-600 mg (2-3 over the counter strength tabs) every 6 hours as needed for pain.  OK to take Tylenol 1000 mg (2 extra strength tabs) or 975 mg (3 regular strength tabs) every 6 hours as needed.  Ice/cold pack over area for 10-15 min twice daily.  Stretching and range of motion exercises These exercises warm up your muscles and joints and improve the movement and flexibility of your knee. These exercises also help to relieve pain and stiffness.  Exercise A: Knee flexion, active 1. Lie on your back with both knees straight. If this causes back discomfort, bend your uninjured knee so your foot is flat on the floor. 2. Slowly slide your left / right heel back toward your buttocks until you feel a gentle stretch in the front of your knee or thigh. Stop if you have pain. 3. Hold for3 seconds. 4. Slowly slide your left / right heel back to the starting position. 10 total repetitions. Repeat 2 times. Complete this exercise 3 times a week.  Exercise B: Knee extension, sitting 1. Sit with your left / right heel propped on a chair, a coffee table, or a footstool. Do not have anything under your knee to support it. 2. Allow your leg muscles to relax, letting gravity straighten out your knee. You should feel a stretch behind your left / right knee. 3. If told by your health care provide just above your kneecap. 4. Hold this position for 3 seconds. 5. Repeat for a total of 10 repetitions. Repeat 2 times. Complete this stretch 3 times a week.  Strengthening exercises These exercises build strength and endurance in your knee. Endurance is the ability to use your muscles for a long time, even after they get tired.  Exercise C: Quadriceps, isometric 1. Lie on your back with your left / right leg extended and your other knee bent. Put a rolled towel or small pillow under your right/left knee if told by your health care provider. 2. Slowly tense the muscles in the front of your left / right thigh  by pushing the back of your knee down. You should see your knee cap slide up toward your hip or see increased dimpling just above the knee. 3. For 3 seconds, keep the muscle as tight as you can without increasing your pain. 4. Relax the muscles slowly and completely. Repeat for 10 total repetitions. Repeat 2 times. Complete this exercise 3 times a week. Exercise D: Straight leg raises (quadriceps) 1. Lie on your back with your left / right leg extended and your other knee bent. 2. Tense the muscles in the front of your left / right thigh. You should see your kneecap slide up or see increased dimpling just above the knee. 3. Keep these muscles tight as you raise your leg 4-6 inches (10-15 cm) off the floor. 4. Hold this position for 3 seconds. 5. Keep these muscles tense as you lower your leg. 6. Relax the muscles slowly and completely. Repeat for a total of 10 repetitions. Repeat 2 times. Complete this exercise 3 times a week.  Exercise E: Hamstring curls 1. On the floor or a bed, lie on your abdomen with your legs straight. Put a folded towel or small pillow under your left / right thigh, just above your kneecap. 2. Slowly bend your left / right knee as far as you can without pain. Keep your hips flat against the floor or bed. 3. Hold this position for 3 seconds. 4. Slowly lower your  leg to the starting position. Repeat for a total of 10 repetitions. Repeat 2 times. Complete this exercise 3 times per week.  Stretching exercises These exercises warm up your muscles and joints and improve the movement and flexibility of your knee. These exercises also help to relieve pain and stiffness.  Exercise A: Quadriceps, prone 1. Lie on your abdomen on a firm surface, such as a bed or padded floor. 2. Bend your left / right knee and hold your ankle. If you cannot reach your ankle or pant leg, loop a belt around your foot and grab the belt instead. 3. Gently pull your heel toward your buttocks. Your  knee should not slide out to the side. You should feel a stretch in the front of your thigh and knee. 4. Hold this position for 30 seconds. Repeat 2 times. Complete this stretch 3 times a week.  Exercise B: Hamstring, doorway 1. Lie on your back in front of a doorway with your left / right leg resting against the wall and your other leg flat on the floor in the doorway. There should be a slight bend in your left / right knee. 2. Straighten your left / right knee. You should feel a stretch behind your knee or thigh. If you do not feel that stretch, scoot your buttocks closer to the door. 3. Hold this position for 30 seconds. Repeat 2 times. Complete this stretch 3 times a week.  Strengthening exercises These exercises build strength and endurance in your knee and leg muscles. Endurance is the ability to use your muscles for a long time, even after they get tired.   Exercise D: Wall slides (quadriceps) 1. Lean your back against a smooth wall or door, and walk your feet out 18-24 inches (45-61 cm) from it. 2. Place your feet hip-width apart. 3. Slowly slide down the wall or door until your knees bend 90 degrees. Keep your knees over your heels, not over your toes. Keep your knees in line with your hips. 4. Hold for 2 seconds. 5. Stand up to rest for 60 seconds. Repeat 2 times. Complete this exercise 3 times a week.  Exercise E: Bridge (hip extensors) 1. Lie on your back on a firm surface with your knees bent and your feet flat on the floor. 2. Tighten your buttocks muscles and lift your bottom off the floor until your trunk is level with your thighs. ? Do not arch your back. ? You should feel the muscles working in your buttocks and the back of your thighs. 3. Hold this position for 2 seconds. 4. Slowly lower your hips to the starting position. 5. Let your buttocks muscles relax completely between repetitions. Repeat 2 times. Complete this exercise 3 times a week.

## 2017-04-10 NOTE — Progress Notes (Signed)
Musculoskeletal Exam  Patient: Lisa [name] DOB: [birth year]  DOS: 04/10/2017  SUBJECTIVE:  Chief Complaint:   Chief Complaint  Patient presents with  . Knee Pain    left    Lisa [name] is a 57 y.o.  female for evaluation and treatment of L knee pain.   Onset:  2 months ago. No recent injury or change in activity.  Location: Inside Character:  aching  Progression of issue:  is moderately worse Associated symptoms: Difficulty walking/going up stairs Treatment: to date has been rest, ice, OTC NSAIDS, acetaminophen and heat.   Neurovascular symptoms: no Mom and dad had OA.   ROS: Musculoskeletal/Extremities: +L knee pain  Past Medical History:  Diagnosis Date  . Diabetes mellitus without complication (HCC)    type II  . Insomnia     Objective: VITAL SIGNS: BP 122/82 (BP Location: Left Arm, Patient Position: Sitting, Cuff Size: Large)   Pulse 76   Temp 98.2 F (36.8 C) (Oral)   Ht 5\' 3"  (1.6 m)   Wt 227 lb 6 oz (103.1 kg)   SpO2 97%   BMI 40.28 kg/m  Constitutional: Well formed, well developed. No acute distress. Cardiovascular: Brisk cap refill Thorax & Lungs: No accessory muscle use Musculoskeletal: L knee.   Normal active range of motion: Yes. Normal passive range of motion: yes Tenderness to palpation: no Deformity: no Ecchymosis: no Tests positive: Stine's (medially) Tests negative: Lachman's, varus/valgus, patellar apprehension/grind Neurologic: Normal sensory function. No focal deficits noted.  Psychiatric: Normal mood. Age appropriate judgment and insight. Alert & oriented x 3.    Assessment:  Chronic pain of left knee - Plan: DG Knee Complete 4 Views Left, Diclofenac Sodium 2 % SOLN  GAD (generalized anxiety disorder) - Plan: sertraline (ZOLOFT) 50 MG tablet  Diabetes mellitus type 2 in obese (HCC) - Plan: Insulin Detemir (LEVEMIR FLEXTOUCH) 100 UNIT/ML Pen, dapagliflozin propanediol (FARXIGA) 10 MG TABS tablet  Plan: Orders as above. Addressed #1  today, refills on #2 and #3.  Home stretches/exercises, XR knee. Offered injection, she declined. Will try topical NSAID +Tylenol +ice.  F/u in 1 mo to reck knee pain. The patient voiced understanding and agreement to the plan.   Jilda Rocheicholas Paul [place], DO 04/10/17  12:09 PM

## 2017-04-13 ENCOUNTER — Encounter: Payer: Self-pay | Admitting: Family Medicine

## 2017-04-14 ENCOUNTER — Other Ambulatory Visit: Payer: Self-pay | Admitting: Family Medicine

## 2017-04-14 DIAGNOSIS — E1169 Type 2 diabetes mellitus with other specified complication: Secondary | ICD-10-CM

## 2017-04-14 DIAGNOSIS — E669 Obesity, unspecified: Principal | ICD-10-CM

## 2017-04-24 ENCOUNTER — Ambulatory Visit: Payer: BC Managed Care – PPO | Admitting: Family Medicine

## 2017-04-24 ENCOUNTER — Encounter: Payer: Self-pay | Admitting: Family Medicine

## 2017-04-24 VITALS — BP 120/78 | HR 85 | Temp 98.9°F | Ht 63.0 in | Wt 228.2 lb

## 2017-04-24 DIAGNOSIS — L03211 Cellulitis of face: Secondary | ICD-10-CM

## 2017-04-24 MED ORDER — SULFAMETHOXAZOLE-TRIMETHOPRIM 800-160 MG PO TABS: 1.0000 | ORAL_TABLET | Freq: Two times a day (BID) | ORAL | 0 refills | Status: DC

## 2017-04-24 MED ORDER — AMOXICILLIN 875 MG PO TABS
875.0000 mg | ORAL_TABLET | Freq: Two times a day (BID) | ORAL | 0 refills | Status: AC
Start: 2017-04-24 — End: 2017-05-01

## 2017-04-24 NOTE — Progress Notes (Signed)
Pre visit review using our clinic review tool, if applicable. No additional management support is needed unless otherwise documented below in the visit note. 

## 2017-04-24 NOTE — Patient Instructions (Addendum)
Things to look out for: increasing pain not relieved by ibuprofen/acetaminophen, fevers, spreading redness, drainage of pus, or foul odor.  Artificial tears like Refresh and Systane may be used for comfort. OK to get generic version. Generally people use them every 2-4 hours, but you can use them as much as you want because there is no medication in it.

## 2017-04-24 NOTE — Progress Notes (Signed)
Chief Complaint  Patient presents with  . Eye Pain    right eye red and swollen    Lisa [name] is a 57 y.o. female here for a skin complaint.  Duration: 2 days Location: R eye Pruritic? Yes Painful? Yes Drainage? No New soaps/lotions/topicals/detergents? Yes Sick contacts? No Other associated symptoms: swelling Therapies tried thus far: Benadryl and topical hydrocortisone  ROS:  Const: No fevers Skin: As noted in HPI  Past Medical History:  Diagnosis Date  . Diabetes mellitus without complication (HCC)    type II  . Insomnia    No Known Allergies   BP 120/78 (BP Location: Left Arm, Patient Position: Sitting, Cuff Size: Large)   Pulse 85   Temp 98.9 F (37.2 C) (Oral)   Ht 5\' 3"  (1.6 m)   Wt 228 lb 4 oz (103.5 kg)   SpO2 97%   BMI 40.43 kg/m  Gen: awake, alert, appearing stated age Lungs: No accessory muscle use Skin: See below, +TTP and warmth compared to surrounding skin. No drainage, fluctuance, excoriation Psych: Age appropriate judgment and insight  Media Information     Cellulitis of face - Plan: sulfamethoxazole-trimethoprim (BACTRIM DS,SEPTRA DS) 800-160 MG tablet, amoxicillin (AMOXIL) 875 MG tablet  Orders as above. Warning signs and symptoms verbalized and written down in AVS.  F/u prn. The patient voiced understanding and agreement to the plan.  Jilda Rocheicholas Paul [place], DO 04/24/17 12:30 PM

## 2017-04-28 ENCOUNTER — Ambulatory Visit: Payer: BC Managed Care – PPO | Admitting: Family

## 2017-05-05 ENCOUNTER — Ambulatory Visit: Payer: BC Managed Care – PPO | Admitting: Family Medicine

## 2017-05-15 ENCOUNTER — Ambulatory Visit: Payer: BC Managed Care – PPO | Admitting: Family Medicine

## 2017-05-15 ENCOUNTER — Encounter: Payer: Self-pay | Admitting: Family Medicine

## 2017-05-15 ENCOUNTER — Telehealth: Payer: Self-pay

## 2017-05-15 ENCOUNTER — Telehealth: Payer: Self-pay | Admitting: Family

## 2017-05-15 VITALS — BP 108/72 | HR 85 | Temp 98.6°F | Ht 63.0 in | Wt 228.2 lb

## 2017-05-15 DIAGNOSIS — E669 Obesity, unspecified: Secondary | ICD-10-CM

## 2017-05-15 DIAGNOSIS — E1169 Type 2 diabetes mellitus with other specified complication: Secondary | ICD-10-CM

## 2017-05-15 DIAGNOSIS — F5104 Psychophysiologic insomnia: Secondary | ICD-10-CM

## 2017-05-15 MED ORDER — TRAZODONE HCL 50 MG PO TABS: 25.0000 mg | ORAL_TABLET | Freq: Every evening | ORAL | 3 refills | Status: DC | PRN

## 2017-05-15 MED ORDER — ONETOUCH LANCETS MISC: 6 refills | Status: DC

## 2017-05-15 MED ORDER — GLUCOSE BLOOD VI STRP: ORAL_STRIP | 6 refills | Status: DC

## 2017-05-15 NOTE — Progress Notes (Signed)
Pre visit review using our clinic review tool, if applicable. No additional management support is needed unless otherwise documented below in the visit note. 

## 2017-05-15 NOTE — Telephone Encounter (Signed)
Copied from CRM [phone]. Topic: Inquiry >> May 15, 2017  9:37 AM Lisa [name], Lisa [name] wrote: Reason for CRM: FYI:: Patient called in to schedule an appt with Dr Carmelia RollerWendling for diabetic management. I advised patient that her PCP was Sandford CrazeMelissa O'Sullivan & that if she wanted to [facility] that we could & she just kept saying that [facility]. I told her that I could send a message to see if we could transfer her care but she wanted to be seen today because her sugar was 153 fasting. She said she always requests Dr Carmelia RollerWendling. She said that she does not want to see Efraim KaufmannMelissa because she is not a doctor and she prefers to see a doctor. I put the patient on a brief hold and tried to contact the office, I did not get anyone so I asked the [facility], Marcelino DusterMichelle. She said that since it looks like she has been seeing Dr Carmelia RollerWendling to go ahead and schedule the appt per the patient.

## 2017-05-15 NOTE — Telephone Encounter (Signed)
OK with me.

## 2017-05-15 NOTE — Telephone Encounter (Signed)
OK to change if that is what is being asked. TY.

## 2017-05-15 NOTE — Telephone Encounter (Signed)
[name], Will you approve of the transfer ?  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

## 2017-05-15 NOTE — Progress Notes (Signed)
Subjective:   Chief Complaint  Patient presents with  . Diabetes    Lisa [name] is a 57 y.o. female here for follow-up of diabetes.   The patient has been having symptoms of hyperglycemia.  She has been having a headache and general feeling that is similar to when she was first diagnosed and had high blood sugar readings.  In the middle the day when she checks it has been up to 178 and in the AM up to 150's, which is high for her.  She does not routinely check during the middle of the day.  She is increased her nightly Lantus to 27 units/day and is compliant with for Farxiga 10 mg daily.  She is not on metformin due to GI adverse effects. She is walking and reports a healthy diet.  Her last A1c was 11.  She was started on Zoloft around 2 months ago and has not noticed a significant improvement.  She realizes she has anxiety secondary to her job.  She has difficulty falling asleep on some nights.  She would like something to help with the sleep.  Past Medical History:  Diagnosis Date  . Diabetes mellitus without complication (HCC)    type II  . Insomnia       Past Surgical History:  Procedure Laterality Date  . ABDOMINAL HYSTERECTOMY  1999   adhesions  . APPENDECTOMY    . CESAREAN SECTION  1987  . ECTOPIC PREGNANCY SURGERY  1992  . OTHER SURGICAL HISTORY  2004   "bowel obstruction-- open wound"  . TUBAL LIGATION  1991   Allergies as of 05/15/2017   No Known Allergies     Medication List        Accurate as of 05/15/17  5:20 PM. Always use your most recent med list.          atorvastatin 40 MG tablet Commonly known as:  LIPITOR Take 1 tablet (40 mg total) by mouth daily.   dapagliflozin propanediol 10 MG Tabs tablet Commonly known as:  FARXIGA Take 10 mg by mouth daily.   Diclofenac Sodium 2 % Soln Apply thin layer over knee 4 times daily as needed for pain.   glucose blood test strip Commonly known as:  ONETOUCH VERIO Test once daily to check blood sugar.  DXE11.9     LEVEMIR FLEXTOUCH 100 UNIT/ML Pen Generic drug:  Insulin Detemir Inject 14 Units into the skin 2 (two) times daily.   lisinopril 5 MG tablet Commonly known as:  PRINIVIL,ZESTRIL Take 1 tablet (5 mg total) by mouth daily.   ONE TOUCH LANCETS Misc Test as directed once daily to check blood sugar.  DXE11.9   traZODone 50 MG tablet Commonly known as:  DESYREL Take 0.5-1 tablets (25-50 mg total) by mouth at bedtime as needed for sleep.   Vitamin D3 3000 units Tabs Take 1 tablet by mouth daily.        Review of Systems: Cardiovascular:  No chest pain Neurologic: +HA  Objective:  BP 108/72 (BP Location: Left Arm, Patient Position: Sitting, Cuff Size: Large)   Pulse 85   Temp 98.6 F (37 C) (Oral)   Ht 5\' 3"  (1.6 m)   Wt 228 lb 4 oz (103.5 kg)   SpO2 97%   BMI 40.43 kg/m  General:  Well developed, well nourished, in no apparent distress Lungs: No access muscle use Psych: Age appropriate judgment and insight  Assessment:   Diabetes mellitus type 2 in obese (HCC) - Plan: Insulin  Detemir (LEVEMIR FLEXTOUCH) 100 UNIT/ML Pen, ONE TOUCH LANCETS MISC, glucose blood (ONETOUCH VERIO) test strip  Psychophysiological insomnia - Plan: traZODone (DESYREL) 50 MG tablet   Plan:   Orders as above. I think she needs better 24-hour coverage because her sugars are not digestive of such a high A1c.  I will change her to twice daily Levemir 14 units per dose.  Continue Marcelline Deist for now.  Counseled on diet and exercise.  Consider Actos versus going up her mind in the future.  She has been on glimepiride in the past with some results.  Also will consider GLP-1. Stop Zoloft, start trazodone. F/u in 1 mo. The patient voiced understanding and agreement to the plan.  Jilda Roche [place], DO 05/15/17 5:20 PM

## 2017-05-15 NOTE — Telephone Encounter (Signed)
Thank you. Chart will be updated.

## 2017-05-15 NOTE — Telephone Encounter (Signed)
OK w me.  

## 2017-05-15 NOTE — Telephone Encounter (Signed)
Please advise 

## 2017-05-15 NOTE — Patient Instructions (Addendum)
Levemir- 14 units in AM and another 14 units in PM. Send me your sugar readings in 1 week.   Continue Farxiga.   Keep the diet clean and stay active.

## 2017-05-17 ENCOUNTER — Other Ambulatory Visit: Payer: Self-pay | Admitting: Family Medicine

## 2017-05-17 DIAGNOSIS — E669 Obesity, unspecified: Principal | ICD-10-CM

## 2017-05-17 DIAGNOSIS — E1169 Type 2 diabetes mellitus with other specified complication: Secondary | ICD-10-CM

## 2017-05-17 MED ORDER — GLUCOSE BLOOD VI STRP: ORAL_STRIP | 6 refills | Status: DC

## 2017-05-17 MED ORDER — ONETOUCH LANCETS MISC: 6 refills | Status: DC

## 2017-05-19 ENCOUNTER — Encounter: Payer: Self-pay | Admitting: Family Medicine

## 2017-05-22 ENCOUNTER — Other Ambulatory Visit: Payer: Self-pay | Admitting: Family Medicine

## 2017-05-22 DIAGNOSIS — E669 Obesity, unspecified: Principal | ICD-10-CM

## 2017-05-22 DIAGNOSIS — E1169 Type 2 diabetes mellitus with other specified complication: Secondary | ICD-10-CM

## 2017-05-22 MED ORDER — GLUCOSE BLOOD VI STRP: ORAL_STRIP | 6 refills | Status: DC

## 2017-05-22 NOTE — Progress Notes (Signed)
Changed insulin dosing based on home readings.

## 2017-06-14 ENCOUNTER — Encounter: Payer: Self-pay | Admitting: Family Medicine

## 2017-06-14 ENCOUNTER — Ambulatory Visit: Payer: BC Managed Care – PPO | Admitting: Family Medicine

## 2017-06-14 VITALS — BP 110/70 | HR 92 | Temp 98.1°F | Ht 63.0 in | Wt 230.1 lb

## 2017-06-14 DIAGNOSIS — F5104 Psychophysiologic insomnia: Secondary | ICD-10-CM

## 2017-06-14 DIAGNOSIS — E669 Obesity, unspecified: Secondary | ICD-10-CM | POA: Diagnosis not present

## 2017-06-14 DIAGNOSIS — E1169 Type 2 diabetes mellitus with other specified complication: Secondary | ICD-10-CM

## 2017-06-14 MED ORDER — INSULIN DETEMIR 100 UNIT/ML FLEXPEN: PEN_INJECTOR | SUBCUTANEOUS | 5 refills | Status: DC

## 2017-06-14 MED ORDER — AMITRIPTYLINE HCL 50 MG PO TABS: 50.0000 mg | ORAL_TABLET | Freq: Every day | ORAL | 2 refills | Status: DC

## 2017-06-14 NOTE — Progress Notes (Signed)
Pre visit review using our clinic review tool, if applicable. No additional management support is needed unless otherwise documented below in the visit note. 

## 2017-06-14 NOTE — Progress Notes (Signed)
Chief Complaint  Patient presents with  . Follow-up    sleep and Diabetes    Subjective: Patient is a 57 y.o. female here for f/u dm.  Patient has a history of type 2 diabetes.  Her morning sugars range from 130-160.  This is improved from her most recent readings after increasing her insulin.  She is currently taking 18 units in the morning 16 units at night.  Her diet is improving.  She is not very physically active overall. She has an eye appt coming up. She is not compliant w statin and knows she needs to do better. Is taking [place] w/ no issues.   She is also continued to have issues sleeping.  She is prescribed trazodone after stopping Zoloft with minimal at best relief.  She knows that her job causes her anxiety and poor sleep.  She has heard about counseling/cognitive behavioral therapy in the past but is not currently interested.  She has never been on any TCA in the past.  ROS: Heart: Denies chest pain  Psych: +anxiety, +insomnia  Family History  Problem Relation Age of Onset  . Diabetes Mother        type II  . Hyperthyroidism Mother   . Diabetes Father        type II  . Diabetes Paternal Grandmother    Past Medical History:  Diagnosis Date  . Diabetes mellitus without complication (HCC)    type II  . Insomnia    No Known Allergies  Current Outpatient Medications:  .  glucose blood (ONETOUCH VERIO) test strip, Test three times daily to check blood sugar.  DXE11.9, Disp: 200 each, Rfl: 6 .  Insulin Detemir (LEVEMIR FLEXTOUCH) 100 UNIT/ML Pen, Inject 21 units in the morning and 18 units at night., Disp: 15 mL, Rfl: 5 .  ONE TOUCH LANCETS MISC, Test as directed three times daily to check blood sugar.  DXE11.9, Disp: 200 each, Rfl: 6 .  amitriptyline (ELAVIL) 50 MG tablet, Take 1 tablet (50 mg total) by mouth at bedtime. Take 1/2 tab nightly for first 2 weeks., Disp: 30 tablet, Rfl: 2 .  atorvastatin (LIPITOR) 40 MG tablet, Take 1 tablet (40 mg total) by mouth daily.  (Patient not taking: Reported on 06/14/2017), Disp: 90 tablet, Rfl: 3 .  dapagliflozin propanediol (FARXIGA) 10 MG TABS tablet, Take 10 mg by mouth daily., Disp: 30 tablet, Rfl: 2  Objective: BP 110/70 (BP Location: Left Arm, Patient Position: Sitting, Cuff Size: Large)   Pulse 92   Temp 98.1 F (36.7 C) (Oral)   Ht  (1.6 m)   Wt 230 lb 2 oz (104.4 kg)   SpO2 95%   BMI 40.76 kg/m  General: Awake, appears stated age HEENT: MMM, EOMi Heart: RRR, 1+ pitting edema b/l up to distal 1/3 of tibia b/l Lungs: CTAB, no rales, wheezes or rhonchi. No accessory muscle use Psych: Age appropriate judgment and insight, normal affect and mood  Assessment and Plan: Diabetes mellitus type 2 in obese (HCC) - Plan: Hemoglobin A1c, Insulin Detemir (LEVEMIR FLEXTOUCH) 100 UNIT/ML Pen, Microalbumin / creatinine urine ratio  Psychophysiological insomnia - Plan: amitriptyline (ELAVIL) 50 MG tablet  Orders as above. Ck above, she will schedule eye exam. Increase Levemir to 21 u in AM and 18 u at night, cont [place].  Counseled on diet and exercise, she is doing better. Stop Trazodone, start Elavil. Could try Belsomra in past. Counseling info for LB provided.  F/u in 6 weeks.  The patient  voiced understanding and agreement to the plan.  Jilda Roche [place], DO 06/14/17  4:40 PM

## 2017-06-14 NOTE — Patient Instructions (Addendum)
Sleep is important to [place] all. Getting good sleep is imperative to adequate functioning during the day. Work with our counselors who are trained to help people obtain quality sleep. Call [phone] to schedule an appointment or if you are curious about insurance coverage/cost.  Let me know if you decide to tweak your insulin dosing.   Keep the diet clean and stay physically active.  Stop the trazodone.  Let us know if you need anything.

## 2017-06-15 LAB — HEMOGLOBIN A1C: HEMOGLOBIN A1C: 7.8 % — AB (ref 4.6–6.5)

## 2017-06-15 LAB — MICROALBUMIN / CREATININE URINE RATIO
Creatinine,U: 82.8 mg/dL
MICROALB/CREAT RATIO: 3.5 mg/g (ref 0.0–30.0)
Microalb, Ur: 2.9 mg/dL — ABNORMAL HIGH (ref 0.0–1.9)

## 2017-07-26 ENCOUNTER — Ambulatory Visit: Payer: BC Managed Care – PPO | Admitting: Family Medicine

## 2017-09-15 ENCOUNTER — Other Ambulatory Visit: Payer: Self-pay | Admitting: Family Medicine

## 2017-09-15 DIAGNOSIS — F5104 Psychophysiologic insomnia: Secondary | ICD-10-CM

## 2017-09-15 MED ORDER — AMITRIPTYLINE HCL 50 MG PO TABS: 50.0000 mg | ORAL_TABLET | Freq: Every day | ORAL | 2 refills | Status: DC

## 2017-09-28 ENCOUNTER — Encounter: Payer: Self-pay | Admitting: Family Medicine

## 2017-09-28 DIAGNOSIS — F5104 Psychophysiologic insomnia: Secondary | ICD-10-CM

## 2017-09-29 MED ORDER — AMITRIPTYLINE HCL 50 MG PO TABS: 50.0000 mg | ORAL_TABLET | Freq: Every day | ORAL | 2 refills | Status: DC

## 2017-09-29 NOTE — Telephone Encounter (Signed)
Sent!

## 2017-10-30 ENCOUNTER — Encounter: Payer: Self-pay | Admitting: Family Medicine

## 2017-11-01 ENCOUNTER — Other Ambulatory Visit: Payer: Self-pay | Admitting: Family Medicine

## 2017-11-01 MED ORDER — FLUCONAZOLE 150 MG PO TABS: 150.0000 mg | ORAL_TABLET | Freq: Once | ORAL | 0 refills | Status: AC

## 2017-11-16 ENCOUNTER — Ambulatory Visit: Payer: BC Managed Care – PPO | Admitting: Family Medicine

## 2017-11-16 ENCOUNTER — Other Ambulatory Visit (HOSPITAL_COMMUNITY)
Admission: RE | Admit: 2017-11-16 | Discharge: 2017-11-16 | Disposition: A | Discharge status: Home / Self Care | Payer: BC Managed Care – PPO | Source: Ambulatory Visit | Attending: Family Medicine | Admitting: Family Medicine

## 2017-11-16 ENCOUNTER — Other Ambulatory Visit: Payer: Self-pay

## 2017-11-16 ENCOUNTER — Encounter: Payer: Self-pay | Admitting: Family Medicine

## 2017-11-16 VITALS — BP 122/82 | HR 88 | Temp 98.3°F | Ht 63.0 in | Wt 231.6 lb

## 2017-11-16 DIAGNOSIS — N898 Other specified noninflammatory disorders of vagina: Secondary | ICD-10-CM | POA: Diagnosis not present

## 2017-11-16 DIAGNOSIS — R300 Dysuria: Secondary | ICD-10-CM | POA: Diagnosis not present

## 2017-11-16 DIAGNOSIS — R8290 Unspecified abnormal findings in urine: Secondary | ICD-10-CM | POA: Diagnosis not present

## 2017-11-16 LAB — POCT URINALYSIS DIPSTICK
GLUCOSE UA: POSITIVE — AB
Ketones, UA: NEGATIVE
PROTEIN UA: POSITIVE — AB
Urobilinogen, UA: 2 E.U./dL — AB
pH, UA: 5.5 (ref 5.0–8.0)

## 2017-11-16 MED ORDER — CIPROFLOXACIN HCL 500 MG PO TABS: 500.0000 mg | ORAL_TABLET | Freq: Two times a day (BID) | ORAL | 0 refills | Status: DC

## 2017-11-16 NOTE — Progress Notes (Signed)
Lisa [name] is a 57 y.o. female who is a patient of Dr. Carmelia Roller and presents to our office today for an acute visit made for concern about a yeast infection. Today, she complains of vaginal itching, burning, and pain x about 1 mo. She states she also feels some "swelling or irritation" when she wipes. Upon further questioning, pt states the itching and burning are more external than internal. + dysuria, frequency, increased urgency. Yesterday, she noted a small amount of bright red blood on toilet paper when wiping after urinating. No vaginal odor or discharge. No fever, chills, back pain. No h/o kidney stones. She has tried taking AZO 2 tabs yesterday, as well as diflucan a few weeks ago as prescribed by PCP and OTC Vagisil. She has increased her water intake and decreased her caffeine intake.  She notes a h/o vaginal dryness. She and husband started using a new lubricant in 09/2017, about 5-6 weeks before above symptoms began.  Pt notes a h/o UTI, overactive bladder, and yeast infections.  She also notes a h/o chronic microscopic hematuria.  She had a total hysterectomy (1999).    Pt also notes increased heartburn symptoms as well as difficulty swallowing in the past few months. She states these have improved in the past few weeks. She has not seen her PCP or any specialist for this. She has DM but does not check BS. She is taking her insulin but states she often forgets all of her other meds.  She notes seeing urology and nephro in the past but not since moving to [place] a few years ago.    Past Medical History:  Diagnosis Date  . Diabetes mellitus without complication (HCC)    type II  . Insomnia    Past Surgical History:  Procedure Laterality Date  . ABDOMINAL HYSTERECTOMY  1999   adhesions  . APPENDECTOMY    . CESAREAN SECTION  1987  . ECTOPIC PREGNANCY SURGERY  1992  . OTHER SURGICAL HISTORY  2004   "bowel obstruction-- open wound"  . TUBAL LIGATION  1991   Social History    Socioeconomic History  . Marital status: Married    Spouse name: Not on file  . Number of children: Not on file  . Years of education: Not on file  . Highest education level: Not on file  Occupational History  . Not on file  Social Needs  . Financial resource strain: Not on file  . Food insecurity:    Worry: Not on file    Inability: Not on file  . Transportation needs:    Medical: Not on file    Non-medical: Not on file  Tobacco Use  . Smoking status: Never Smoker  . Smokeless tobacco: Never Used  Substance and Sexual Activity  . Alcohol use: Yes    Comment: special occasions "couple times a year"  . Drug use: No  . Sexual activity: Yes  Lifestyle  . Physical activity:    Days per week: Not on file    Minutes per session: Not on file  . Stress: Not on file  Relationships  . Social connections:    Talks on phone: Not on file    Gets together: Not on file    Attends religious service: Not on file    Active member of club or organization: Not on file    Attends meetings of clubs or organizations: Not on file    Relationship status: Not on file  . Intimate partner violence:  Fear of current or ex partner: Not on file    Emotionally abused: Not on file    Physically abused: Not on file    Forced sexual activity: Not on file  Other Topics Concern  . 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 Diabetes Mother        type II  . Hyperthyroidism Mother   . Diabetes Father        type II  . Diabetes Paternal Grandmother    Outpatient Encounter Medications as of 11/16/2017  Medication Sig  . glucose blood (ONETOUCH VERIO) test strip Test three times daily to check blood sugar.  DXE11.9  . Insulin Detemir  (LEVEMIR FLEXTOUCH) 100 UNIT/ML Pen Inject 21 units in the morning and 18 units at night.  . ONE TOUCH LANCETS MISC Test as directed three times daily to check blood sugar.  DXE11.9  . amitriptyline (ELAVIL) 50 MG tablet Take 1 tablet (50 mg total) by mouth at bedtime. (Patient not taking: Reported on 11/16/2017)  . atorvastatin (LIPITOR) 40 MG tablet Take 1 tablet (40 mg total) by mouth daily. (Patient not taking: Reported on 06/14/2017)  . dapagliflozin propanediol (FARXIGA) 10 MG TABS tablet Take 10 mg by mouth daily. (Patient not taking: Reported on 11/16/2017)  . fluconazole (DIFLUCAN) 150 MG tablet    No facility-administered encounter medications on file as of 11/16/2017.    Review of Systems  Constitutional: Negative for chills, fever and malaise/fatigue.  HENT: Negative.   Eyes: Negative.   Respiratory: Negative for cough, shortness of breath and wheezing.   Cardiovascular: Negative for chest pain, palpitations and leg swelling.  Gastrointestinal: Positive for heartburn. Negative for abdominal pain, blood in stool, constipation, diarrhea, nausea and vomiting.  Genitourinary: Positive for dysuria, frequency and urgency. Negative for flank pain and hematuria.  Neurological: Negative for dizziness, weakness and headaches.  Psychiatric/Behavioral: Negative for depression. The patient has insomnia. The patient is not nervous/anxious.      No Known Allergies BP 122/82 (BP Location: Left Arm, Patient Position: Sitting, Cuff Size: Large)   Pulse 88   Temp 98.3 F (36.8 C) (Oral)   Ht 5\' 3"  (1.6 m)   Wt 231 lb 9.6 oz (105.1 kg)   SpO2 96%   BMI 41.03 kg/m  General appearance: alert, cooperative, appears stated age and morbidly obese Back: no CVA tenderness Abdomen: soft, non-tender; bowel sounds normal; no masses,  no organomegaly and obese Pelvic: cervix normal in appearance, external genitalia normal, no adnexal masses or tenderness, no bladder tenderness, no cervical motion  tenderness, uterus surgically absent and vagina normal without discharge Neurologic: Grossly normal  Urine dipstick shows positive for nitrites, leukocytes, red blood cells, glucose, urobilinogen.  A/P: 1. Vaginal itching   2. Dysuria   3. Abnormal urinalysis   - normal pelvic/GU exam  - abnormal UA, will send for cx and will initiate abx therapy based on abnormal UA and symptoms. If cx is neg, will d/c abx - Rx: cipro 500mg  BID x 10 days - pt states she gets yeast infx with bactrim and  - vaginal cx done for BV, yeast, trich - will contact pt will result when available -  f/u with PCP if symptoms worsen or do not improve - also advised pt to f/u with PCP for GI symptoms (heartburn, difficulty swallowing at times) and her chronic medical conditions. Discussed plan and reviewed medications with patient, including risks, benefits, and potential side effects. Pt expressed understand. All questions answered.

## 2017-11-17 ENCOUNTER — Other Ambulatory Visit: Payer: Self-pay | Admitting: Family Medicine

## 2017-11-17 ENCOUNTER — Encounter: Payer: Self-pay | Admitting: Family Medicine

## 2017-11-17 DIAGNOSIS — B3731 Acute candidiasis of vulva and vagina: Secondary | ICD-10-CM

## 2017-11-17 DIAGNOSIS — B373 Candidiasis of vulva and vagina: Secondary | ICD-10-CM

## 2017-11-17 LAB — CERVICOVAGINAL ANCILLARY ONLY
Bacterial vaginitis: NEGATIVE
CANDIDA VAGINITIS: POSITIVE — AB
Trichomonas: NEGATIVE

## 2017-11-17 LAB — URINE CULTURE
MICRO NUMBER:: 91220197
SPECIMEN QUALITY:: ADEQUATE

## 2017-11-17 MED ORDER — FLUCONAZOLE 150 MG PO TABS: 150.0000 mg | ORAL_TABLET | Freq: Once | ORAL | 0 refills | Status: AC

## 2017-11-20 ENCOUNTER — Ambulatory Visit: Payer: Self-pay

## 2017-11-20 NOTE — Telephone Encounter (Signed)
Pt. Reports she is still having painful urination, burning and low back pain. Feels like she has a fever, but does not have a thermometer.Also still has vaginal itching.States "I feel like I'm on fire." Still taking Cipro and took Diflucan. Took OTC AZO last night for relief. Made an appointment for tomorrow with Ms. Lendell Caprice. No availability with Dr. Carmelia Roller.   Reason for Disposition . [1] Taking antibiotic > 72 hours (3 days) for UTI AND [2] painful urination or frequency not improved  Answer Assessment - Initial Assessment Questions 1. ANTIBIOTIC: "What antibiotic are you taking?" "How many times per day?"     Cipro 2. DURATION: "When was the antibiotic started?"     11/16/17 3. MAIN SYMPTOM: "What is the main symptom you are concerned about?"     Painful urination 4. FEVER: "Do you have a fever?" If so, ask: "What is it, how was it measured, and when did it start?"     Yes - but doesn't have a thermometer 5. OTHER SYMPTOMS: "Do you have any other symptoms?" (e.g., flank pain, vaginal discharge, blood in urine)     Itching, low back pain, nausea, vomiting  Protocols used: URINARY TRACT INFECTION ON ANTIBIOTIC FOLLOW-UP CALL - FEMALE-A-AH

## 2017-11-21 ENCOUNTER — Ambulatory Visit: Payer: BC Managed Care – PPO | Admitting: Family

## 2017-11-21 ENCOUNTER — Encounter: Payer: Self-pay | Admitting: Family

## 2017-11-21 VITALS — BP 126/83 | HR 83 | Temp 98.5°F | Resp 16 | Ht 63.0 in | Wt 229.0 lb

## 2017-11-21 DIAGNOSIS — E669 Obesity, unspecified: Secondary | ICD-10-CM

## 2017-11-21 DIAGNOSIS — R300 Dysuria: Secondary | ICD-10-CM | POA: Diagnosis not present

## 2017-11-21 DIAGNOSIS — E1169 Type 2 diabetes mellitus with other specified complication: Secondary | ICD-10-CM

## 2017-11-21 DIAGNOSIS — B373 Candidiasis of vulva and vagina: Secondary | ICD-10-CM

## 2017-11-21 DIAGNOSIS — R809 Proteinuria, unspecified: Secondary | ICD-10-CM

## 2017-11-21 DIAGNOSIS — B3731 Acute candidiasis of vulva and vagina: Secondary | ICD-10-CM

## 2017-11-21 LAB — POC URINALSYSI DIPSTICK (AUTOMATED)
BILIRUBIN UA: NEGATIVE
Glucose, UA: POSITIVE — AB
KETONES UA: NEGATIVE
LEUKOCYTES UA: NEGATIVE
Nitrite, UA: NEGATIVE
PROTEIN UA: POSITIVE — AB
Spec Grav, UA: >=1.03 — AB (ref 1.010–1.025)
Urobilinogen, UA: NEGATIVE E.U./dL — AB
pH, UA: 6 (ref 5.0–8.0)

## 2017-11-21 MED ORDER — FLUCONAZOLE 150 MG PO TABS: 150.0000 mg | ORAL_TABLET | Freq: Once | ORAL | 0 refills | Status: AC

## 2017-11-21 MED ORDER — LISINOPRIL 5 MG PO TABS: 5.0000 mg | ORAL_TABLET | Freq: Every day | ORAL | 3 refills | Status: DC

## 2017-11-21 MED ORDER — DAPAGLIFLOZIN PROPANEDIOL 10 MG PO TABS: 10.0000 mg | ORAL_TABLET | Freq: Every day | ORAL | 2 refills | Status: DC

## 2017-11-21 NOTE — Progress Notes (Signed)
Subjective:    Patient ID: Lisa [name], female    DOB: [birth year], 57 y.o.   MRN: [id number]  HPI  Patient is a 57 yr old female who presents today with chief complaint of vaginal itching and dysuria. She saw Dr. Barron Alvine on 10/10 with same complaints and was treated with cipro 500mg  bid x 10 days and diflucan.  Urine culture only grew 10K of colonizer organism.  BV negative, candida + and trich negative.  Denies new sexual contacts.   Reports that she only had a small amount of improvement thus far in  Her vaginal irritation since she started diflucan. She has a hx of IC and she has seen urology in the past.    Only taking her sleep medicine and her insulin.  Got "lax" on her other medications.   Lab Results  Component Value Date   HGBA1C 7.8 (H) 06/14/2017   HGBA1C 11.2 (H) 03/13/2017   HGBA1C 9.4 (H) 12/08/2016   Lab Results  Component Value Date   MICROALBUR 2.9 (H) 06/14/2017   LDLCALC 147 (H) 03/13/2017   CREATININE 0.64 03/13/2017      Review of Systems See HPI  Past Medical History:  Diagnosis Date  . Diabetes mellitus without complication (HCC)    type II  . Insomnia      Social History   Socioeconomic History  . Marital status: Married    Spouse name: Not on file  . Number of children: Not on file  . Years of education: Not on file  . Highest education level: Not on file  Occupational History  . Not on file  Social Needs  . Financial resource strain: Not on file  . Food insecurity:    Worry: Not on file    Inability: Not on file  . Transportation needs:    Medical: Not on file    Non-medical: Not on file  Tobacco Use  . Smoking status: Never Smoker  . Smokeless tobacco: Never Used  Substance and Sexual Activity  . Alcohol use: Yes    Comment: special occasions "couple times a year"  . Drug use: No  . Sexual activity: Yes  Lifestyle  . Physical activity:    Days per week: Not on file    Minutes per session: Not on file  . Stress: Not on  file  Relationships  . Social connections:    Talks on phone: Not on file    Gets together: Not on file    Attends religious service: Not on file    Active member of club or organization: Not on file    Attends meetings of clubs or organizations: Not on file    Relationship status: Not on file  . Intimate partner violence:    Fear of current or ex partner: Not on file    Emotionally abused: Not on file    Physically abused: Not on file    Forced sexual activity: Not on file  Other Topics Concern  . 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 ABDOMINAL HYSTERECTOMY  1999   adhesions  . APPENDECTOMY    . CESAREAN SECTION  1987  . ECTOPIC  PREGNANCY SURGERY  1992  . OTHER SURGICAL HISTORY  2004   "bowel obstruction-- open wound"  . TUBAL LIGATION  1991    Family History  Problem Relation Age of Onset  . Diabetes Mother        type II  . Hyperthyroidism Mother   . Diabetes Father        type II  . Diabetes Paternal Grandmother     No Known Allergies  Current Outpatient Medications on File Prior to Visit  Medication Sig Dispense Refill  . ciprofloxacin (CIPRO) 500 MG tablet Take 1 tablet (500 mg total) by mouth 2 (two) times daily. 20 tablet 0  . dapagliflozin propanediol (FARXIGA) 10 MG TABS tablet Take 10 mg by mouth daily. 30 tablet 2  . glucose blood (ONETOUCH VERIO) test strip Test three times daily to check blood sugar.  DXE11.9 200 each 6  . Insulin Detemir (LEVEMIR FLEXTOUCH) 100 UNIT/ML Pen Inject 21 units in the morning and 18 units at night. 15 mL 5  . ONE TOUCH LANCETS MISC Test as directed three times daily to check blood sugar.  DXE11.9 200 each 6  . amitriptyline (ELAVIL) 50 MG tablet Take 1 tablet  (50 mg total) by mouth at bedtime. (Patient not taking: Reported on 11/16/2017) 30 tablet 2  . atorvastatin (LIPITOR) 40 MG tablet Take 1 tablet (40 mg total) by mouth daily. (Patient not taking: Reported on 06/14/2017) 90 tablet 3   No current facility-administered medications on file prior to visit.     BP 126/83 (BP Location: Right Arm, Patient Position: Sitting, Cuff Size: Large)   Pulse 83   Temp 98.5 F (36.9 C) (Oral)   Resp 16   Ht 5\' 3"  (1.6 m)   Wt 229 lb (103.9 kg)   SpO2 100%   BMI 40.57 kg/m       Objective:   Physical Exam  Constitutional: She is oriented to person, place, and time. She appears well-developed and well-nourished.  Cardiovascular: Normal rate, regular rhythm and normal heart sounds.  No murmur heard. Pulmonary/Chest: Effort normal and breath sounds normal. No respiratory distress. She has no wheezes.  Neurological: She is alert and oriented to person, place, and time.  Psychiatric: Her behavior is normal. Judgment and thought content normal.  Flat affect          Assessment & Plan:  Vaginal candidiasis-has not responded to Diflucan thus far symptomatically.  Will give another tablet of Diflucan on I also encouraged her to pick up an over-the-counter Monistat 7 to begin and to apply internally and externally.  We discussed that there is glucose in her urine and I suspect that her sugars are uncontrolled.  We discussed that uncontrolled sugars can contribute to vaginal candidiasis.  She has not been taking for farxiga but plans to restart.  Will defer to PCP re: possible change in therapy due to recurrent vaginal candidiasis.  She is overdue for DM follow up and I have asked her to arrange follow up with PCP for this.  In addition we discussed that if symptoms do not improve she may benefit from vaginal estrogen.  Dysuria- urinalysis today is unremarkable with the exception of trace blood on dip.  She has a history of interstitial cystitis.  Urine culture  was negative except for contaminant.  I have advised her to stop Cipro.  We will reculture and plan to treat if positive.  I question whether her symptoms of dysuria are related to her interstitial cystitis  and possibly some vulvar irritation.  Microalbuminuria- Also of note she states that she is no longer taking lisinopril for renal protection.  I have advised her to restart.  Refill has been sent.

## 2017-11-21 NOTE — Patient Instructions (Addendum)
Begin a monistat 7, take second dose of diflucan. Restart lisinopril, farxiga, lipitor. Stop cipro. Call if symptoms worsen or if they fail to improve.

## 2017-11-21 NOTE — Addendum Note (Signed)
Addended by: Wilford Corner on: 11/21/2017 08:19 AM   Modules accepted: Orders

## 2017-11-22 LAB — URINE CULTURE
MICRO NUMBER: 91238225
Result:: NO GROWTH
SPECIMEN QUALITY: ADEQUATE

## 2017-11-29 ENCOUNTER — Ambulatory Visit: Payer: BC Managed Care – PPO | Admitting: Family Medicine

## 2018-01-25 ENCOUNTER — Other Ambulatory Visit: Payer: Self-pay | Admitting: Family Medicine

## 2018-01-25 DIAGNOSIS — E669 Obesity, unspecified: Principal | ICD-10-CM

## 2018-01-25 DIAGNOSIS — E1169 Type 2 diabetes mellitus with other specified complication: Secondary | ICD-10-CM

## 2018-02-26 ENCOUNTER — Encounter: Payer: Self-pay | Admitting: Family Medicine

## 2018-02-26 ENCOUNTER — Ambulatory Visit: Payer: BC Managed Care – PPO | Admitting: Family Medicine

## 2018-02-26 VITALS — BP 112/87 | HR 87 | Temp 97.9°F | Ht 63.0 in | Wt 228.0 lb

## 2018-02-26 DIAGNOSIS — E669 Obesity, unspecified: Secondary | ICD-10-CM | POA: Diagnosis not present

## 2018-02-26 DIAGNOSIS — E1169 Type 2 diabetes mellitus with other specified complication: Secondary | ICD-10-CM

## 2018-02-26 DIAGNOSIS — M79605 Pain in left leg: Secondary | ICD-10-CM

## 2018-02-26 DIAGNOSIS — G2581 Restless legs syndrome: Secondary | ICD-10-CM | POA: Diagnosis not present

## 2018-02-26 DIAGNOSIS — M79604 Pain in right leg: Secondary | ICD-10-CM | POA: Diagnosis not present

## 2018-02-26 MED ORDER — ATORVASTATIN CALCIUM 40 MG PO TABS: 40.0000 mg | ORAL_TABLET | Freq: Every day | ORAL | 3 refills | Status: DC

## 2018-02-26 MED ORDER — INSULIN DETEMIR 100 UNIT/ML FLEXPEN: PEN_INJECTOR | SUBCUTANEOUS | 2 refills | Status: DC

## 2018-02-26 NOTE — Progress Notes (Signed)
Pre visit review using our clinic review tool, if applicable. No additional management support is needed unless otherwise documented below in the visit note. 

## 2018-02-26 NOTE — Progress Notes (Signed)
Subjective:   Chief Complaint  Patient presents with  . Follow-up    diabetes    Lisa Cavaliereresa Hollister is a 58 y.o. female here for follow-up of diabetes.   Lisa [name] has not been taking her sugars routinely. Patient does require insulin.   Medications include: no oral meds at this time, has stopped everything.  Diet: eats a lot of carbs Exercise: stays active  Hx of insomnia, failed traz, Doxepin, Elavil. Helped intermittently. Biggest issue now is leg tightness that keeps her up. Job is stressful. Feels that legs need to move and moves them a lot.   Past Medical History:  Diagnosis Date  . Diabetes mellitus without complication (HCC)    type II  . Insomnia      Related testing: Date of retinal exam: Due; cannot afford at this time, planning for [place] Pneumovax: done Flu Shot: done  Review of Systems: Pulmonary:  No SOB Cardiovascular:  No chest pain  Objective:  BP 112/87 (BP Location: Left Arm, Patient Position: Sitting, Cuff Size: Large)   Pulse 87   Temp 97.9 F (36.6 C) (Oral)   Ht 5\' 3"  (1.6 m)   Wt 228 lb (103.4 kg)   SpO2 97%   BMI 40.39 kg/m  General:  Well developed, well nourished, in no apparent distress Skin:  Warm, no pallor or diaphoresis Head:  Normocephalic, atraumatic Eyes:  Pupils equal and round, sclera anicteric without injection  Lungs:  CTAB, no access msc use Cardio:  RRR, no bruits, no LE edema Musculoskeletal:  Symmetrical muscle groups noted without atrophy or deformity Neuro:  Sensation intact to pinprick on feet Psych: Age appropriate judgment and insight  Assessment:   Diabetes mellitus type 2 in obese (HCC) - Plan: Hemoglobin A1c, CBC, Basic metabolic panel, HM DIABETES FOOT EXAM, atorvastatin (LIPITOR) 40 MG tablet, Insulin Detemir (LEVEMIR FLEXTOUCH) 100 UNIT/ML Pen, DISCONTINUED: Insulin Detemir (LEVEMIR FLEXTOUCH) 100 UNIT/ML Pen, DISCONTINUED: atorvastatin (LIPITOR) 40 MG tablet  RLS (restless legs syndrome)  Pain in both lower  extremities - Plan: Ferritin, IBC panel, Magnesium   Plan:   Orders as above. Counseled on diet and exercise. Refill insulin, will see what A1c is, will plan on adjusting Levemir.  Ck lytes and iron. If normal, will call in Requip. F/u in 3 mo. The patient voiced understanding and agreement to the plan.  Jilda Rocheicholas [place], DO 02/26/18 3:57 PM

## 2018-02-26 NOTE — Patient Instructions (Addendum)
Give [place] 2-3 business days to get the results of your labs back.   Go back on the Lipitor!!  Keep the diet clean and stay active.  Stay hydrated.  Let us know if you need anything.

## 2018-02-27 ENCOUNTER — Other Ambulatory Visit: Payer: Self-pay | Admitting: Family Medicine

## 2018-02-27 DIAGNOSIS — E669 Obesity, unspecified: Principal | ICD-10-CM

## 2018-02-27 DIAGNOSIS — E1169 Type 2 diabetes mellitus with other specified complication: Secondary | ICD-10-CM

## 2018-02-27 LAB — CBC
HEMATOCRIT: 46.5 % — AB (ref 36.0–46.0)
HEMOGLOBIN: 15.5 g/dL — AB (ref 12.0–15.0)
MCHC: 33.3 g/dL (ref 30.0–36.0)
MCV: 83.7 fl (ref 78.0–100.0)
Platelets: 343 10*3/uL (ref 150.0–400.0)
RBC: 5.56 Mil/uL — AB (ref 3.87–5.11)
RDW: 14.1 % (ref 11.5–15.5)
WBC: 9.5 10*3/uL (ref 4.0–10.5)

## 2018-02-27 LAB — IBC PANEL
IRON: 71 ug/dL (ref 42–145)
Saturation Ratios: 19.7 % — ABNORMAL LOW (ref 20.0–50.0)
Transferrin: 257 mg/dL (ref 212.0–360.0)

## 2018-02-27 LAB — FERRITIN: Ferritin: 118 ng/mL (ref 10.0–291.0)

## 2018-02-27 LAB — BASIC METABOLIC PANEL
BUN: 14 mg/dL (ref 6–23)
CHLORIDE: 98 meq/L (ref 96–112)
CO2: 29 mEq/L (ref 19–32)
Calcium: 10 mg/dL (ref 8.4–10.5)
Creatinine, Ser: 0.58 mg/dL (ref 0.40–1.20)
GFR: 107.41 mL/min (ref >60.00)
GLUCOSE: 344 mg/dL — AB (ref 70–99)
POTASSIUM: 4.3 meq/L (ref 3.5–5.1)
SODIUM: 136 meq/L (ref 135–145)

## 2018-02-27 LAB — HEMOGLOBIN A1C: Hgb A1c MFr Bld: 12.5 % — ABNORMAL HIGH (ref 4.6–6.5)

## 2018-02-27 LAB — MAGNESIUM: MAGNESIUM: 1.8 mg/dL (ref 1.5–2.5)

## 2018-02-27 MED ORDER — INSULIN DETEMIR 100 UNIT/ML FLEXPEN: PEN_INJECTOR | SUBCUTANEOUS | 2 refills | Status: DC

## 2018-03-01 ENCOUNTER — Other Ambulatory Visit: Payer: Self-pay | Admitting: Family Medicine

## 2018-03-01 MED ORDER — ROPINIROLE HCL 2 MG PO TABS: 2.0000 mg | ORAL_TABLET | Freq: Every day | ORAL | 2 refills | Status: DC

## 2018-03-07 ENCOUNTER — Encounter: Payer: Self-pay | Admitting: Family Medicine

## 2018-03-07 ENCOUNTER — Ambulatory Visit: Payer: Self-pay

## 2018-03-07 NOTE — Telephone Encounter (Signed)
Called informed the patient of PCP instructions. She verbalized///wrote down instructions.

## 2018-03-07 NOTE — Telephone Encounter (Signed)
27 units twice daily for 3 days, if still elevated go to 30 units twice daily for 3 days. Ty.

## 2018-03-07 NOTE — Telephone Encounter (Signed)
Pt. Reports she has not been checking her blood sugars "like I should be." Last night it was 333 and this morning fasting 283. Taking Levemir 24 units am and 21 units pm. Has had a lot of stress recently. Does have her monitor with her today at work and will keep a check on her glucose. Wants to know Dr. Hollie Beach recommendations. Contact number [phone].  Reason for Disposition . [1] Blood glucose > 300 mg/dL (08.6 mmol/L) AND [5] two or more times in a row  Answer Assessment - Initial Assessment Questions 1. BLOOD GLUCOSE: "What is your blood glucose level?"      Fasting this morning  283 2. ONSET: "When did you check the blood glucose?"       0705 3. USUAL RANGE: "What is your glucose level usually?" (e.g., usual fasting morning value, usual evening value)     Unsure 4. KETONES: "Do you check for ketones (urine or blood test strips)?" If yes, ask: "What does the test show now?"      No 5. TYPE 1 or 2:  "Do you know what type of diabetes you have?"  (e.g., Type 1, Type 2, Gestational; doesn't know)      Type 1  6. INSULIN: "Do you take insulin?" "What type of insulin(s) do you use? What is the mode of delivery? (syringe, pen (e.g., injection or  pump)?"      Yes - Levemir 7. DIABETES PILLS: "Do you take any pills for your diabetes?" If yes, ask: "Have you missed taking any pills recently?"     No 8. OTHER SYMPTOMS: "Do you have any symptoms?" (e.g., fever, frequent urination, difficulty breathing, dizziness, weakness, vomiting)     Headache x 4 days 9. PREGNANCY: "Is there any chance you are pregnant?" "When was your last menstrual period?"     No  Protocols used: DIABETES - HIGH BLOOD SUGAR-A-AH

## 2018-03-09 ENCOUNTER — Ambulatory Visit: Payer: Self-pay | Admitting: Family Medicine

## 2018-03-09 NOTE — Telephone Encounter (Signed)
Pt. Reports she will be going to the Levemir 30 units twice a day as per Dr. Hollie BeachWendling's instructions. Has been watching sugar and carbs, but still has glucose readings 200-300's. Still has a headache. Appointment made at pt.'s request to come in and talk to him about her diabetes for Monday.  Reason for Disposition . [1] Symptoms of high blood sugar (e.g., frequent urination, weak, weight loss) AND [2] not able to test blood glucose  Answer Assessment - Initial Assessment Questions 1. BLOOD GLUCOSE: "What is your blood glucose level?"      Fasting this morning - 230  2 hours later - 329 2. ONSET: "When did you check the blood glucose?"     Today 3. USUAL RANGE: "What is your glucose level usually?" (e.g., usual fasting morning value, usual evening value)     Has been running in the 300's 4. KETONES: "Do you check for ketones (urine or blood test strips)?" If yes, ask: "What does the test show now?"      No 5. TYPE 1 or 2:  "Do you know what type of diabetes you have?"  (e.g., Type 1, Type 2, Gestational; doesn't know)      Type 1 6. INSULIN: "Do you take insulin?" "What type of insulin(s) do you use? What is the mode of delivery? (syringe, pen (e.g., injection or  pump)?"      Levemir 7. DIABETES PILLS: "Do you take any pills for your diabetes?" If yes, ask: "Have you missed taking any pills recently?"     No 8. OTHER SYMPTOMS: "Do you have any symptoms?" (e.g., fever, frequent urination, difficulty breathing, dizziness, weakness, vomiting)     Headache 9. PREGNANCY: "Is there any chance you are pregnant?" "When was your last menstrual period?"     No  Protocols used: DIABETES - HIGH BLOOD SUGAR-A-AH

## 2018-03-12 ENCOUNTER — Encounter: Payer: Self-pay | Admitting: Family Medicine

## 2018-03-12 ENCOUNTER — Ambulatory Visit: Payer: BC Managed Care – PPO | Admitting: Family Medicine

## 2018-03-12 DIAGNOSIS — E669 Obesity, unspecified: Secondary | ICD-10-CM | POA: Diagnosis not present

## 2018-03-12 DIAGNOSIS — E1169 Type 2 diabetes mellitus with other specified complication: Secondary | ICD-10-CM

## 2018-03-12 MED ORDER — INSULIN DETEMIR 100 UNIT/ML FLEXPEN: PEN_INJECTOR | SUBCUTANEOUS | 2 refills | Status: DC

## 2018-03-12 MED ORDER — ONETOUCH LANCETS MISC: 6 refills | Status: DC

## 2018-03-12 MED ORDER — GLUCOSE BLOOD VI STRP: ORAL_STRIP | 6 refills | Status: DC

## 2018-03-12 MED ORDER — SEMAGLUTIDE(0.25 OR 0.5MG/DOS) 2 MG/1.5ML ~~LOC~~ SOPN: 0.5000 mg | PEN_INJECTOR | SUBCUTANEOUS | 2 refills | Status: DC

## 2018-03-12 MED ORDER — DAPAGLIFLOZIN PROPANEDIOL 10 MG PO TABS: 10.0000 mg | ORAL_TABLET | Freq: Every day | ORAL | 2 refills | Status: DC

## 2018-03-12 NOTE — Progress Notes (Signed)
Chief Complaint  Patient presents with  . Blood Sugar Problem    Subjective: Patient is a 58 y.o. female here for BS issue.  Pt is currently on Levemir 30 u BID.  Her sugars average the mid 200s.  She brought in a food journal which she details at around 1000 cal daily consisting of boiled eggs, salads, fruits, and low-calorie vegetables.  She is very frustrated that this is not helping.  She is not on any other current medication.  She has not tolerated metformin well in the past.  She is trying to stay physically active.  She is having headaches and increased urination.   ROS: GU: +polyuria Endo: No wt loss  Past Medical History:  Diagnosis Date  . Diabetes mellitus without complication (HCC)    type II  . Insomnia     Objective: BP 120/68 (BP Location: Left Arm, Patient Position: Sitting, Cuff Size: Large)   Pulse 88   Temp (!) 97.5 F (36.4 C) (Oral)   Ht 5\' 3"  (1.6 m)   Wt 228 lb 6 oz (103.6 kg)   SpO2 97%   BMI 40.45 kg/m  General: Awake, appears stated age Lungs: No accessory muscle use Psych: Age appropriate judgment and insight, normal affect and mood  Assessment and Plan: Diabetes mellitus type 2 in obese (HCC) - Plan: Ambulatory referral to Endocrinology, Insulin Detemir (LEVEMIR FLEXTOUCH) 100 UNIT/ML Pen, ONE TOUCH LANCETS MISC, glucose blood (ONETOUCH VERIO) test strip, DISCONTINUED: Insulin Detemir (LEVEMIR FLEXTOUCH) 100 UNIT/ML Pen  Add back SGLT-2 inhibitor, add weekly GLP-1.  Increase Levemir to 35 units twice daily.  Place referral for endocrinology. Discussed aiming for a roughly 1500 -calorie/day diet.  She is doing 6 meals, roughly 20 carbs per meal/snack. Follow-up with me in 4 weeks if she is unable to get in with the endocrinology team in a reasonable timeframe. The patient voiced understanding and agreement to the plan.  Greater than 25 minutes were spent face to face with the patient with greater than 50% of this time spent counseling on  insulin, pathophys, medication usage, carb counting/diet, exercise.   Jilda Roche [place], DO 03/12/18  4:34 PM

## 2018-03-12 NOTE — Patient Instructions (Addendum)
If you do not hear anything about your referral in the next 1-2 weeks, call our office and ask for an update.  1500 calories/day. Aim for around 125 carbs per day. 40 carbs for breakfast, 40 for lunch and 45 for dinner. This is a very rough estimate.  For 6 meals, 20-25 carbs per snack/meal.  Try to make protein consist of a majority of your calories.  Check your sugars twice daily. Once in AM before eating and once in the evening around 2 hours after eating.  Send me your sugars in 2 weeks if you don't have an endo appointment soon.   Let us know if you need anything.

## 2018-03-13 ENCOUNTER — Telehealth: Payer: Self-pay

## 2018-03-13 ENCOUNTER — Telehealth: Payer: Self-pay | Admitting: Family Medicine

## 2018-03-13 NOTE — Telephone Encounter (Signed)
Contacted [place] at [employer] [employer]; conference call initiated with [place] at [facility]; however not able to clarify order; Elizbeth Squires will contact the provider; pt last seen 03/12/17 by Dr Carmelia Roller; will route to office for final disposition.

## 2018-03-13 NOTE — Telephone Encounter (Signed)
0.25 mg weekly for 4 weeks then 0.5 mg weekly thereafter. TY.

## 2018-03-13 NOTE — Telephone Encounter (Signed)
Copied from CRM [id number]. Topic: Quick Communication - See Telephone Encounter >> Mar 13, 2018  8:57 AM Angela Nevin wrote: CRM for notification. See Telephone encounter for: 03/13/18.  Amy, pharmacist at [facility], calling regarding Semaglutide,0.25 or 0.5MG /DOS, (OZEMPIC, 0.25 OR 0.5 MG/DOSE,) 2 MG/1.5ML SOPN. Pharmacy is needing clarification, specifically the directions. Please advise.   [facility] pharmacy# [phone]

## 2018-03-13 NOTE — Telephone Encounter (Signed)
Error

## 2018-03-14 NOTE — Telephone Encounter (Signed)
Pharmacy informed of instructions.

## 2018-03-23 ENCOUNTER — Encounter: Payer: Self-pay | Admitting: Endocrinology

## 2018-03-23 ENCOUNTER — Ambulatory Visit: Payer: BC Managed Care – PPO | Admitting: Endocrinology

## 2018-03-23 DIAGNOSIS — E1169 Type 2 diabetes mellitus with other specified complication: Secondary | ICD-10-CM

## 2018-03-23 DIAGNOSIS — E669 Obesity, unspecified: Secondary | ICD-10-CM

## 2018-03-23 MED ORDER — FREESTYLE LIBRE 14 DAY SENSOR MISC: 1.0000 | 3 refills | Status: DC

## 2018-03-23 MED ORDER — INSULIN DETEMIR 100 UNIT/ML FLEXPEN: 30.0000 [IU] | PEN_INJECTOR | Freq: Two times a day (BID) | SUBCUTANEOUS | 11 refills | Status: DC

## 2018-03-23 MED ORDER — SEMAGLUTIDE(0.25 OR 0.5MG/DOS) 2 MG/1.5ML ~~LOC~~ SOPN: 0.5000 mg | PEN_INJECTOR | SUBCUTANEOUS | 11 refills | Status: DC

## 2018-03-23 MED ORDER — INSULIN DETEMIR 100 UNIT/ML FLEXPEN: 30.0000 [IU] | PEN_INJECTOR | Freq: Two times a day (BID) | SUBCUTANEOUS | 2 refills | Status: DC

## 2018-03-23 MED ORDER — FREESTYLE LIBRE 14 DAY READER DEVI: 1.0000 | Freq: Once | 0 refills | Status: AC

## 2018-03-23 NOTE — Patient Instructions (Addendum)
good diet and exercise significantly improve the control of your diabetes.  please let me know if you wish to be referred to a dietician.  high blood sugar is very risky to your health.  you should see an eye doctor and dentist every year.  It is very important to get all recommended vaccinations.  Controlling your blood pressure and cholesterol drastically reduces the damage diabetes does to your body.  Those who smoke should quit.  Please discuss these with your doctor.  check your blood sugar twice a day.  vary the time of day when you check, between before the 3 meals, and at bedtime.  also check if you have symptoms of your blood sugar being too high or too low.  please keep a record of the readings and bring it to your next appointment here (or you can bring the meter itself).  You can write it on any piece of paper.  please call us sooner if your blood sugar goes below 70, or if you have a lot of readings over 200.  Please continue the same insulin and farxiga for now, and: Increase the Ozempic to 0.5 mg per week. I have sent a prescription to your pharmacy, for the continuous glucose monitor.    Please call or message [place] in 1-2 weeks, to tell us how the blood sugar is doing.      Bariatric Surgery You have so much to gain by losing weight.  You may have already tried every diet and exercise plan imaginable.  And, you may have sought advice from your family physician, too.   Sometimes, in spite of such diligent efforts, you may not be able to achieve long-term results by yourself.  In cases of severe obesity, bariatric or weight loss surgery is a proven method of achieving long-term weight control.  Our Services Our bariatric surgery programs offer our patients new hope and long-term weight-loss solution.  Since introducing our services in 2003, we have conducted more than 2,400 successful procedures.  Our program is designated as a Investment banker, corporate by the Metabolic and Bariatric Surgery  Accreditation and Quality Improvement Program (MBSAQIP), a Child psychotherapist that sets rigorous patient safety and outcome standards.  Our program is also designated as a Engineer, manufacturing systems by [employer].   Our exceptional weight-loss surgery team specializes in diagnosis, treatment, follow-up care, and ongoing support for our patients with severe weight loss challenges.  We currently offer laparoscopic sleeve gastrectomy, gastric bypass, and adjustable gastric band (LAP-BAND).    Attend our Bariatrics Seminar Choosing to undergo a bariatric procedure is a big decision, and one that should not be taken lightly.  You now have two options in how you learn about weight-loss surgery - in person or online.  Our objective is to ensure you have all of the information that you need to evaluate the advantages and obligations of this life changing procedure.  Please note that you are not alone in this process, and our experienced team is ready to assist and answer all of your questions.  There are several ways to register for a seminar (either on-line or in person): 1)  Call [phone] 2) Go on-line to [facility] and register for either type of seminar.  [email or web address]

## 2018-03-23 NOTE — Progress Notes (Signed)
Subjective:    Patient ID: Lisa [name], female    DOB: 11-15-1960, 58 y.o.   MRN: [id number]  HPI pt is referred by Dr Carmelia RollerWendling, for diabetes.  Pt states DM was dx'ed in 2005 (she had GDM in 1986 and 1989); she has mild if any neuropathy of the lower extremities; she is unaware of any associated chronic complications; she has been on insulin since 2011; pt says her diet is improved, but exercise is poor; she has never had pancreatitis, pancreatic surgery, severe hypoglycemia or DKA.  In 1/20, insulin was increased, and other 2 meds were added.  Meter is downloaded today, and the printout is scanned into the record.  She checks BID.  cbg varies from 107-333.  There is no trend throughout the day.  Ozempic is 0.25 mg/week.    Past Medical History:  Diagnosis Date  . Diabetes mellitus without complication (HCC)    type II  . Insomnia     Past Surgical History:  Procedure Laterality Date  . ABDOMINAL HYSTERECTOMY  1999   adhesions  . APPENDECTOMY    . CESAREAN SECTION  1987  . ECTOPIC PREGNANCY SURGERY  1992  . OTHER SURGICAL HISTORY  2004   "bowel obstruction-- open wound"  . TUBAL LIGATION  1991    Social History   Socioeconomic History  . Marital status: Married    Spouse name: Not on file  . Number of children: Not on file  . Years of education: Not on file  . Highest education level: Not on file  Occupational History  . Not on file  Social Needs  . Financial resource strain: Not on file  . Food insecurity:    Worry: Not on file    Inability: Not on file  . Transportation needs:    Medical: Not on file    Non-medical: Not on file  Tobacco Use  . Smoking status: Never Smoker  . Smokeless tobacco: Never Used  Substance and Sexual Activity  . Alcohol use: Yes    Comment: special occasions "couple times a year"  . Drug use: No  . Sexual activity: Yes  Lifestyle  . Physical activity:    Days per week: Not on file    Minutes per session: Not on file  . Stress: Not  on file  Relationships  . Social connections:    Talks on phone: Not on file    Gets together: Not on file    Attends religious service: Not on file    Active member of club or organization: Not on file    Attends meetings of clubs or organizations: Not on file    Relationship status: Not on file  . Intimate partner violence:    Fear of current or ex partner: Not on file    Emotionally abused: Not on file    Physically abused: Not on file    Forced sexual activity: Not on file  Other Topics Concern  . Not on file  Social History Narrative   Works as a Systems developerspecial Ed Teacher for [employer]   [place]oved from [place], [place]   Married   Grown children (2 sons one in [place] he has 2 sons and a daughter, another son in [place] and has one child)    Current Outpatient Medications on File Prior to Visit  Medication Sig Dispense Refill  . atorvastatin (LIPITOR) 40 MG tablet Take 1 tablet (40 mg total) by mouth daily. 90 tablet 3  . dapagliflozin  propanediol (FARXIGA) 10 MG TABS tablet Take 10 mg by mouth daily. 30 tablet 2  . glucose blood (ONETOUCH VERIO) test strip Test three times daily to check blood sugar.  DXE11.9 200 each 6  . ONE TOUCH LANCETS MISC Test as directed three times daily to check blood sugar.  DXE11.9 200 each 6  . rOPINIRole (REQUIP) 2 MG tablet Take 1 tablet (2 mg total) by mouth at bedtime. Take 1/2 tab for first week. 30 tablet 2   No current facility-administered medications on file prior to visit.     No Known Allergies  Family History  Problem Relation Age of Onset  . Diabetes Mother        type II  . Hyperthyroidism Mother   . Diabetes Father        type II  . Diabetes Paternal Grandmother     BP 132/84 (BP Location: Left Arm, Patient Position: Sitting, Cuff Size: Normal)   Pulse 85   Ht 5\' 3"  (1.6 m)   Wt 229 lb (103.9 kg)   SpO2 97%   BMI 40.57 kg/m    Review of Systems denies blurry vision, chest pain, sob, n/v, muscle cramps,  excessive diaphoresis, memory loss, depression, cold intolerance, rhinorrhea, and easy bruising.  She has intermitt headache and urinary frequency.  She has lost a few lbs.       Objective:   Physical Exam VS: see vs page GEN: no distress HEAD: head: no deformity eyes: no periorbital swelling, no proptosis external nose and ears are normal mouth: no lesion seen NECK: supple, thyroid is not enlarged CHEST WALL: no deformity LUNGS: clear to auscultation CV: reg rate and rhythm, no murmur ABD: abdomen is soft, nontender.  no hepatosplenomegaly.  not distended.  no hernia.  Old healed surgical scars MUSCULOSKELETAL: muscle bulk and strength are grossly normal.  no obvious joint swelling.  gait is normal and steady EXTEMITIES: no deformity.  no ulcer on the feet.  feet are of normal color and temp.  no edema PULSES: dorsalis pedis intact bilat.  no carotid bruit NEURO:  cn 2-12 grossly intact.   readily moves all 4's.  sensation is intact to touch on the feet SKIN:  Normal texture and temperature.  No rash or suspicious lesion is visible.   NODES:  None palpable at the neck PSYCH: alert, well-oriented.  Does not appear anxious nor depressed.  Lab Results  Component Value Date   HGBA1C 12.5 (H) 02/26/2018   Lab Results  Component Value Date   CREATININE 0.58 02/26/2018   BUN 14 02/26/2018   NA 136 02/26/2018   K 4.3 02/26/2018   CL 98 02/26/2018   CO2 29 02/26/2018   I have reviewed outside records, and summarized: Pt was noted to have elevated a1c, and referred here.  Pt reports that diet and exercise did not seem to be helping glycemic control   I personally reviewed electrocardiogram tracing (05/16/16): Indication: wellness.  Impression: NSR.  No MI.  No hypertrophy. No comparison is available      Assessment & Plan:  Insulin-requiring type 2 DM: severe exacerbation.  Obesity: new to me.  Patient Instructions  good diet and exercise significantly improve the control of  your diabetes.  please let me know if you wish to be referred to a dietician.  high blood sugar is very risky to your health.  you should see an eye doctor and dentist every year.  It is very important to get all recommended vaccinations.  Controlling your blood pressure and cholesterol drastically reduces the damage diabetes does to your body.  Those who smoke should quit.  Please discuss these with your doctor.  check your blood sugar twice a day.  vary the time of day when you check, between before the 3 meals, and at bedtime.  also check if you have symptoms of your blood sugar being too high or too low.  please keep a record of the readings and bring it to your next appointment here (or you can bring the meter itself).  You can write it on any piece of paper.  please call us sooner if your blood sugar goes below 70, or if you have a lot of readings over 200.  Please continue the same insulin and farxiga for now, and: Increase the Ozempic to 0.5 mg per week. I have sent a prescription to your pharmacy, for the continuous glucose monitor.    Please call or message [place] in 1-2 weeks, to tell us how the blood sugar is doing.      Bariatric Surgery You have so much to gain by losing weight.  You may have already tried every diet and exercise plan imaginable.  And, you may have sought advice from your family physician, too.   Sometimes, in spite of such diligent efforts, you may not be able to achieve long-term results by yourself.  In cases of severe obesity, bariatric or weight loss surgery is a proven method of achieving long-term weight control.  Our Services Our bariatric surgery programs offer our patients new hope and long-term weight-loss solution.  Since introducing our services in 2003, we have conducted more than 2,400 successful procedures.  Our program is designated as a Investment banker, corporate by the Metabolic and Bariatric Surgery Accreditation and Quality Improvement Program (MBSAQIP), a  Child psychotherapist that sets rigorous patient safety and outcome standards.  Our program is also designated as a Engineer, manufacturing systems by [employer].   Our exceptional weight-loss surgery team specializes in diagnosis, treatment, follow-up care, and ongoing support for our patients with severe weight loss challenges.  We currently offer laparoscopic sleeve gastrectomy, gastric bypass, and adjustable gastric band (LAP-BAND).    Attend our Bariatrics Seminar Choosing to undergo a bariatric procedure is a big decision, and one that should not be taken lightly.  You now have two options in how you learn about weight-loss surgery - in person or online.  Our objective is to ensure you have all of the information that you need to evaluate the advantages and obligations of this life changing procedure.  Please note that you are not alone in this process, and our experienced team is ready to assist and answer all of your questions.  There are several ways to register for a seminar (either on-line or in person): 1)  Call [phone] 2) Go on-line to [facility] and register for either type of seminar.  [email or web address]

## 2018-03-26 ENCOUNTER — Ambulatory Visit: Payer: BC Managed Care – PPO | Admitting: Family Medicine

## 2018-04-01 ENCOUNTER — Encounter: Payer: Self-pay | Admitting: Family Medicine

## 2018-04-02 ENCOUNTER — Encounter: Payer: Self-pay | Admitting: Family Medicine

## 2018-04-18 ENCOUNTER — Other Ambulatory Visit: Payer: Self-pay | Admitting: General Surgery

## 2018-04-23 ENCOUNTER — Encounter: Payer: Self-pay | Admitting: Family Medicine

## 2018-04-25 ENCOUNTER — Other Ambulatory Visit: Payer: Self-pay

## 2018-04-25 ENCOUNTER — Ambulatory Visit (HOSPITAL_COMMUNITY)
Admission: RE | Admit: 2018-04-25 | Discharge: 2018-04-25 | Disposition: A | Discharge status: Home / Self Care | Payer: BC Managed Care – PPO | Source: Ambulatory Visit | Attending: General Surgery | Admitting: General Surgery

## 2018-04-27 ENCOUNTER — Ambulatory Visit: Payer: BC Managed Care – PPO | Admitting: Endocrinology

## 2018-05-09 ENCOUNTER — Ambulatory Visit: Payer: BC Managed Care – PPO | Admitting: Skilled Nursing Facility1

## 2018-05-21 ENCOUNTER — Other Ambulatory Visit: Payer: Self-pay | Admitting: Family Medicine

## 2018-05-28 ENCOUNTER — Ambulatory Visit (INDEPENDENT_AMBULATORY_CARE_PROVIDER_SITE_OTHER): Payer: BC Managed Care – PPO | Admitting: Professional

## 2018-05-28 ENCOUNTER — Ambulatory Visit: Payer: BC Managed Care – PPO | Admitting: Professional

## 2018-05-28 DIAGNOSIS — F509 Eating disorder, unspecified: Secondary | ICD-10-CM | POA: Diagnosis not present

## 2018-06-04 ENCOUNTER — Ambulatory Visit: Payer: BC Managed Care – PPO | Admitting: Professional

## 2018-06-07 ENCOUNTER — Ambulatory Visit: Payer: BC Managed Care – PPO | Admitting: Professional

## 2018-06-12 ENCOUNTER — Encounter: Payer: Self-pay | Admitting: Family Medicine

## 2018-06-12 ENCOUNTER — Encounter: Payer: Self-pay | Admitting: Endocrinology

## 2018-06-12 DIAGNOSIS — E669 Obesity, unspecified: Principal | ICD-10-CM

## 2018-06-12 DIAGNOSIS — E1169 Type 2 diabetes mellitus with other specified complication: Secondary | ICD-10-CM

## 2018-06-12 MED ORDER — INSULIN DETEMIR 100 UNIT/ML FLEXPEN: 30.0000 [IU] | PEN_INJECTOR | Freq: Two times a day (BID) | SUBCUTANEOUS | 0 refills | Status: DC

## 2018-06-12 NOTE — Telephone Encounter (Signed)
Please advise 

## 2018-06-13 ENCOUNTER — Encounter: Payer: Self-pay | Admitting: Skilled Nursing Facility1

## 2018-06-13 ENCOUNTER — Encounter: Payer: BC Managed Care – PPO | Attending: General Surgery | Admitting: Skilled Nursing Facility1

## 2018-06-13 ENCOUNTER — Other Ambulatory Visit: Payer: Self-pay

## 2018-06-13 DIAGNOSIS — E1169 Type 2 diabetes mellitus with other specified complication: Secondary | ICD-10-CM | POA: Insufficient documentation

## 2018-06-13 DIAGNOSIS — E669 Obesity, unspecified: Secondary | ICD-10-CM | POA: Insufficient documentation

## 2018-06-13 NOTE — Progress Notes (Signed)
Pre-Op Assessment Visit:  Pre-Operative RYGB Surgery  Medical Nutrition Therapy:  Appt start time: 11:11  End time:  12:21  Patient was seen on 06/13/2018 for Pre-Operative Nutrition Assessment. Assessment and letter of approval faxed to [facility] Bariatric Surgery Program coordinator on 06/13/2018.    Referral Stated [facility] Appointments Required: 0  Proposed Surgery Type: RYGB  Pt expectation of surgery: to control diabetes and lose weight and have more energy and be more active   Pt expectation of dietitian: to help guide me   NUTRITION ASSESSMENT   Anthropometrics  Start weight at [facility]: 229 lbs Today's weight: 229 lbs BMI: 40.57 kg/m2     Psychosocial/Lifestyle Pt states she has had diabetes since about 2006 stating she really never checked her blood sugar until more recently : checking 1-2 times a day: fasting 160; 2 hours post prandial 145-173; pt states her blood sugars are all over the place. Pt states he was advised by her doctor to eat less than 25 grams of CHO per meal but she has been trying to get in less than 10g CHO per eating event. Pt states she has been trying to avoid carbs as much as she can. Pt states she does better with snacks. Pt was worried about finances due to her insulin being so expensive.   Medications: Insulin semaglutide faxiga  Labs: 12.5  Diagnoses:  Diabetes  Gastroparesis   Notable Signs/Symptoms Reflux Headaches with high blood sugars    24-Hr Dietary Recall: more recently not paying as close attention to her diet First Meal: eggs and ham or cereal  Snack: 2 T peanut butter and celery or yogurt danon lite and fit Second Meal: ham or tuna sandwich with yogurt or salad  Snack: almonds or cheese Third Meal: chicken with cream of mushroom soup or BBQ sauce with broccoli and peppers  Snack: hummus  Beverages: diet coke, water    Physical Activity  ADL's   Estimated Energy Needs Calories: 1500 Carbohydrate: 170 Protein:  112 Fat: 42   NUTRITION DIAGNOSIS  Overweight/obesity ([place]-3.3) related to past poor dietary habits and physical inactivity as evidenced by patient w/ planned RYGB surgery following dietary guidelines for continued weight loss.    NUTRITION INTERVENTION  Nutrition counseling (C-1) and education (E-2) to facilitate bariatric surgery goals.   Handouts given during visit include:  . Pre-Op Goals . Bariatric Surgery Protein Shakes . Vitamin and Mineral Options    During the appointment today the following Pre-Op Goals were reviewed with the patient: . Log your food and beverage via an app or pen and paper  . Make healthy food choices . Begin to limit portion sizes . Limited concentrated sugars and fried foods . Keep fat/sugar in the single digits per serving on              food labels . Practice CHEWING your food  (aim for 30 chews per bite or until applesauce consistency) . Practice not drinking 15 minutes before, during, and 30 minutes after each meal/snack . Avoid all carbonated beverages  . Avoid/limit caffeinated beverages  . Avoid all sugar-sweetened beverages . Consume 3 meals per day; eat every 3-5 hours . Make a list of non-food related activities . Aim for 64-100 ounces of FLUID daily  . Aim for at least 60-80 grams of PROTEIN daily . Look for a liquid protein source that contain ?15 g protein and ?5 g carbohydrate  (ex: shakes, drinks, shots)   Change readiness: Contemplating   Demonstrated  degree of understanding via: Teach Back      MONITORING & EVALUATION Dietary intake, weekly physical activity, body weight, and pre-op goals reached.    Next Steps  Patient is to call [facility] (once surgery date is scheduled) to be scheduled for Pre-Op Class, which must be at least 2 weeks prior to surgery date or back follow up visit in 1 month

## 2018-06-20 ENCOUNTER — Encounter: Payer: Self-pay | Admitting: Endocrinology

## 2018-06-21 ENCOUNTER — Ambulatory Visit (INDEPENDENT_AMBULATORY_CARE_PROVIDER_SITE_OTHER): Payer: BC Managed Care – PPO | Admitting: Endocrinology

## 2018-06-21 ENCOUNTER — Other Ambulatory Visit: Payer: Self-pay

## 2018-06-21 ENCOUNTER — Telehealth: Payer: Self-pay

## 2018-06-21 ENCOUNTER — Encounter: Payer: Self-pay | Admitting: Endocrinology

## 2018-06-21 DIAGNOSIS — E1169 Type 2 diabetes mellitus with other specified complication: Secondary | ICD-10-CM

## 2018-06-21 DIAGNOSIS — E669 Obesity, unspecified: Secondary | ICD-10-CM

## 2018-06-21 MED ORDER — INSULIN DETEMIR 100 UNIT/ML FLEXPEN: 30.0000 [IU] | PEN_INJECTOR | Freq: Two times a day (BID) | SUBCUTANEOUS | 0 refills | Status: DC

## 2018-06-21 MED ORDER — SEMAGLUTIDE (1 MG/DOSE) 2 MG/1.5ML ~~LOC~~ SOPN: 1.0000 mg | PEN_INJECTOR | SUBCUTANEOUS | 3 refills | Status: DC

## 2018-06-21 NOTE — Telephone Encounter (Signed)
Copied from CRM [id number]. Topic: Appointment Scheduling - Scheduling Inquiry for Clinic >> Jun 21, 2018  9:49 AM Leafy Ro wrote: Reason for CRM: pt is calling and had labs at [facility] and they will fax a copy to dr wendling. Pt is reporting her wbc is 11, a1c 7.5, b12 below normal and urine showed glucose +3. Pt would like to schedule virtual appt with dr wendling. I called office and got d/c

## 2018-06-21 NOTE — Patient Instructions (Signed)
I have sent a prescription to your pharmacy, to increase the Ozempic.   Please continue the same other diabetes medications.  Please come back for a follow-up appointment in 1 month.

## 2018-06-21 NOTE — Progress Notes (Signed)
Subjective:    Patient ID: Lisa [name], female    DOB: 02-Nov-1960, 58 y.o.   MRN: [id number]  HPI  telehealth visit today via doxy video visit.  Alternatives to telehealth are presented to this patient, and the patient agrees to the telehealth visit. Pt is advised of the cost of the visit, and agrees to this, also.   Patient is at home, and I am at the office.   Persons attending the telehealth visit: the patient and I Pt returns for f/u of diabetes mellitus: DM type: Insulin-requiring type 2 Dx'ed: 2005 Complications: gastroparesis and DN Therapy: insulin since 2011 GDM: 1986 and 1989 DKA: never Severe hypoglycemia: never Pancreatitis: never Pancreatic imaging: normal on 2020 [place] Other: goal is to d/c insulin, if possible Interval history: Fasting cbg's are in the mid-100's. She takes Ozempic, 0.5 mg/week.  pt states she feels well in general.  Denies nausea Past Medical History:  Diagnosis Date  . Diabetes mellitus without complication (HCC)    type II  . GERD (gastroesophageal reflux disease)   . Hyperlipidemia   . Insomnia   . Restless leg     Past Surgical History:  Procedure Laterality Date  . ABDOMINAL HYSTERECTOMY  1999   adhesions  . APPENDECTOMY    . CESAREAN SECTION  1987  . ECTOPIC PREGNANCY SURGERY  1992  . OTHER SURGICAL HISTORY  2004   "bowel obstruction-- open wound"  . TUBAL LIGATION  1991    Social History   Socioeconomic History  . Marital status: Married    Spouse name: Not on file  . Number of children: Not on file  . Years of education: Not on file  . Highest education level: Not on file  Occupational History  . Not on file  Social Needs  . Financial resource strain: Not on file  . Food insecurity:    Worry: Not on file    Inability: Not on file  . Transportation needs:    Medical: Not on file    Non-medical: Not on file  Tobacco Use  . Smoking status: Never Smoker  . Smokeless tobacco: Never Used  Substance and Sexual Activity   . Alcohol use: Yes    Comment: special occasions "couple times a year"  . Drug use: No  . Sexual activity: Yes  Lifestyle  . Physical activity:    Days per week: Not on file    Minutes per session: Not on file  . Stress: Not on file  Relationships  . Social connections:    Talks on phone: Not on file    Gets together: Not on file    Attends religious service: Not on file    Active member of club or organization: Not on file    Attends meetings of clubs or organizations: Not on file    Relationship status: Not on file  . Intimate partner violence:    Fear of current or ex partner: Not on file    Emotionally abused: Not on file    Physically abused: Not on file    Forced sexual activity: Not on file  Other Topics Concern  . 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 atorvastatin (LIPITOR) 40 MG tablet Take 1 tablet (40 mg total) by mouth daily. 90 tablet 3  . Continuous Blood Gluc Sensor (FREESTYLE LIBRE 14 DAY SENSOR) MISC 1 Device by Does not apply route every 14 (fourteen) days. 6 each 3  . dapagliflozin propanediol (FARXIGA) 10 MG TABS tablet Take 10 mg by mouth daily. 30 tablet 2  . glucose blood (ONETOUCH VERIO) test strip Test three times daily to check blood sugar.  DXE11.9 200 each 6  . ONE TOUCH LANCETS MISC Test as directed three times daily to check blood sugar.  DXE11.9 200 each 6  . rOPINIRole (REQUIP) 2 MG tablet TAKE 1/2 (ONE-HALF) TABLET BY MOUTH AT BEDTIME FOR 7 DAYS, THEN TAKE 1 AT BEDTIME 90 tablet 0   No current facility-administered medications on file prior to visit.     No Known Allergies  Family History  Problem Relation Age of Onset  . Diabetes Mother         type II  . Hyperthyroidism Mother   . Diabetes Father        type II  . Diabetes Paternal Grandmother     Review of Systems She denies hypoglycemia.      Objective:   Physical Exam  outside test results are reviewed: A1c=7.5%     Assessment & Plan:  Type 2 DM: she needs increased rx.  We discussed.  Goal is to d/c insulin if possible Gastroparesis: we discussed the fact that Ozempic could worsen this.  She agrees to increase it anyway Obesity: we discussed this, also.  She is pursuing bariatric surgery.  I told her I agree with this.  Patient Instructions  I have sent a prescription to your pharmacy, to increase the Ozempic.   Please continue the same other diabetes medications.  Please come back for a follow-up appointment in 1 month.

## 2018-06-22 ENCOUNTER — Other Ambulatory Visit: Payer: Self-pay

## 2018-06-22 ENCOUNTER — Encounter: Payer: Self-pay | Admitting: Family Medicine

## 2018-06-22 ENCOUNTER — Ambulatory Visit (INDEPENDENT_AMBULATORY_CARE_PROVIDER_SITE_OTHER): Payer: BC Managed Care – PPO | Admitting: Family Medicine

## 2018-06-22 DIAGNOSIS — R7989 Other specified abnormal findings of blood chemistry: Secondary | ICD-10-CM

## 2018-06-22 DIAGNOSIS — E611 Iron deficiency: Secondary | ICD-10-CM

## 2018-06-22 DIAGNOSIS — R8290 Unspecified abnormal findings in urine: Secondary | ICD-10-CM

## 2018-06-22 NOTE — Progress Notes (Signed)
Chief Complaint  Patient presents with  . Results    discuss from [facility]    Subjective: Patient is a 59 y.o. female here for f/u abn lab results. Due to COVID-19 pandemic, we are interacting via web portal for an electronic face-to-face visit. I verified patient's ID using 2 identifiers. Patient agreed to proceed with visit via this method. Patient is at work, I am at office. Patient and I are present for visit.   [facility], her bariatric surgery team, drew labs around 1 week ago. There were some abn labs. Her iron was 43, lower limit of normal is 45. Trace blood on UA, this has always been present and further work up was neg. Mild elevation of WBC at 11.6, RBC at 5.64, and HCT at 46.4. No blood in urine or stool. Feels fine otherwise.   ROS: GU: No bleeding  Past Medical History:  Diagnosis Date  . Diabetes mellitus without complication (HCC)    type II  . GERD (gastroesophageal reflux disease)   . Hyperlipidemia   . Insomnia   . Restless leg     Objective: No conversational dyspnea Age appropriate judgment and insight Nml affect and mood  Assessment and Plan: Low iron - Plan: IBC + Ferritin  Abnormal CBC - Plan: IBC + Ferritin, CBC (INCLUDES DIFF/PLT) WITH PATHOLOGIST REVIEW  Abnormal urinalysis - Plan: Urinalysis, Routine w reflex microscopic  Ck above. None is emergent given hx of workup with #3. No clinical s/s's.  The patient voiced understanding and agreement to the plan.  Jilda Roche Dodge, DO 06/22/18  2:16 PM

## 2018-06-22 NOTE — Telephone Encounter (Signed)
SCHEDULED APPT TODAY

## 2018-07-09 ENCOUNTER — Ambulatory Visit (INDEPENDENT_AMBULATORY_CARE_PROVIDER_SITE_OTHER): Payer: BC Managed Care – PPO | Admitting: Professional

## 2018-07-09 DIAGNOSIS — F509 Eating disorder, unspecified: Secondary | ICD-10-CM

## 2018-07-10 ENCOUNTER — Other Ambulatory Visit (INDEPENDENT_AMBULATORY_CARE_PROVIDER_SITE_OTHER): Payer: BC Managed Care – PPO

## 2018-07-10 ENCOUNTER — Other Ambulatory Visit: Payer: Self-pay

## 2018-07-10 DIAGNOSIS — R7989 Other specified abnormal findings of blood chemistry: Secondary | ICD-10-CM

## 2018-07-10 DIAGNOSIS — E611 Iron deficiency: Secondary | ICD-10-CM

## 2018-07-10 DIAGNOSIS — R8290 Unspecified abnormal findings in urine: Secondary | ICD-10-CM | POA: Diagnosis not present

## 2018-07-10 LAB — IBC + FERRITIN
Ferritin: 64.8 ng/mL (ref 10.0–291.0)
Iron: 61 ug/dL (ref 42–145)
Saturation Ratios: 17.2 % — ABNORMAL LOW (ref 20.0–50.0)
Transferrin: 253 mg/dL (ref 212.0–360.0)

## 2018-07-10 LAB — URINALYSIS, ROUTINE W REFLEX MICROSCOPIC
Bilirubin Urine: NEGATIVE
Ketones, ur: NEGATIVE
Leukocytes,Ua: NEGATIVE
Nitrite: NEGATIVE
Specific Gravity, Urine: 1.02 (ref 1.000–1.030)
Total Protein, Urine: NEGATIVE
Urine Glucose: >=1000 — AB
Urobilinogen, UA: 0.2 (ref 0.0–1.0)
pH: 5.5 (ref 5.0–8.0)

## 2018-07-11 ENCOUNTER — Telehealth: Payer: Self-pay | Admitting: *Deleted

## 2018-07-11 LAB — CBC (INCLUDES DIFF/PLT) WITH PATHOLOGIST REVIEW
Absolute Monocytes: 807 cells/uL (ref 200–950)
Basophils Absolute: 65 cells/uL (ref 0–200)
Basophils Relative: 0.6 %
Eosinophils Absolute: 185 cells/uL (ref 15–500)
Eosinophils Relative: 1.7 %
HCT: 46.3 % — ABNORMAL HIGH (ref 35.0–45.0)
Hemoglobin: 14.9 g/dL (ref 11.7–15.5)
Lymphs Abs: 2518 cells/uL (ref 850–3900)
MCH: 26.5 pg — ABNORMAL LOW (ref 27.0–33.0)
MCHC: 32.2 g/dL (ref 32.0–36.0)
MCV: 82.4 fL (ref 80.0–100.0)
MPV: 9.6 fL (ref 7.5–12.5)
Monocytes Relative: 7.4 %
Neutro Abs: 7325 cells/uL (ref 1500–7800)
Neutrophils Relative %: 67.2 %
Platelets: 334 10*3/uL (ref 140–400)
RBC: 5.62 10*6/uL — ABNORMAL HIGH (ref 3.80–5.10)
RDW: 13.7 % (ref 11.0–15.0)
Total Lymphocyte: 23.1 %
WBC: 10.9 10*3/uL — ABNORMAL HIGH (ref 3.8–10.8)

## 2018-07-11 NOTE — Telephone Encounter (Signed)
Received call from lab stating they received add on request for IBC / Ferritin and that was run on 07/10/18.  Request has been cancelled. Please let lab know if there is other testing that PCP is wanting to add.

## 2018-08-06 ENCOUNTER — Encounter: Payer: Self-pay | Admitting: Endocrinology

## 2018-08-06 NOTE — Telephone Encounter (Signed)
Please advise. LOV 06/21/18

## 2018-08-07 ENCOUNTER — Telehealth: Payer: Self-pay | Admitting: Skilled Nursing Facility1

## 2018-08-07 ENCOUNTER — Encounter: Payer: Self-pay | Admitting: Endocrinology

## 2018-08-07 ENCOUNTER — Ambulatory Visit: Payer: BC Managed Care – PPO | Admitting: Endocrinology

## 2018-08-07 ENCOUNTER — Encounter: Payer: BC Managed Care – PPO | Attending: General Surgery | Admitting: Skilled Nursing Facility1

## 2018-08-07 ENCOUNTER — Other Ambulatory Visit: Payer: Self-pay

## 2018-08-07 VITALS — BP 124/76 | HR 92 | Ht 63.0 in | Wt 230.4 lb

## 2018-08-07 DIAGNOSIS — E669 Obesity, unspecified: Secondary | ICD-10-CM | POA: Diagnosis not present

## 2018-08-07 DIAGNOSIS — E1169 Type 2 diabetes mellitus with other specified complication: Secondary | ICD-10-CM | POA: Insufficient documentation

## 2018-08-07 LAB — POCT GLYCOSYLATED HEMOGLOBIN (HGB A1C): Hemoglobin A1C: 7.3 % — AB (ref 4.0–5.6)

## 2018-08-07 MED ORDER — LEVEMIR FLEXTOUCH 100 UNIT/ML ~~LOC~~ SOPN: 15.0000 [IU] | PEN_INJECTOR | Freq: Two times a day (BID) | SUBCUTANEOUS | 0 refills | Status: DC

## 2018-08-07 NOTE — Progress Notes (Signed)
Pre-Operative Nutrition Class:  Appt start time: 4315   End time:  1830.  Patient was seen on 08/07/2018 for Pre-Operative Bariatric Surgery Education at [facility].   Surgery date:  Surgery type: RYGB Start weight at [facility]: 229 Weight today: 229.7  Samples given per MNT protocol. Patient educated on appropriate usage: Bariatric Advantage Multivitamin Lot #[id number] Exp:04/21  Bariatric Advantage Calcium  Lot #[id number] Exp:05/19/19   Protein20 Lot #[id number] Exp:06/26/19  The following the learning objectives were met by the patient during this course:  Identify Pre-Op Dietary Goals and will begin 2 weeks pre-operatively  Identify appropriate sources of fluids and proteins   State protein recommendations and appropriate sources pre and post-operatively  Identify Post-Operative Dietary Goals and will follow for 2 weeks post-operatively  Identify appropriate multivitamin and calcium sources  Describe the need for physical activity post-operatively and will follow MD recommendations  State when to call healthcare provider regarding medication questions or post-operative complications  Handouts given during class include:  Pre-Op Bariatric Surgery Diet Handout  Protein Shake Handout  Post-Op Bariatric Surgery Nutrition Handout  BELT Program Information Flyer  Support Group Information Flyer  [facility] Bariatric Supplements Price List  Follow-Up Plan: Patient will follow-up at [facility] 2 weeks post operatively for diet advancement per MD.

## 2018-08-07 NOTE — Progress Notes (Signed)
Subjective:    Patient ID: Lisa [name], female    DOB: 1960/05/08, 58 y.o.   MRN: [id number]  HPI Pt returns for f/u of diabetes mellitus: DM type: Insulin-requiring type 2 Dx'ed: 2005 Complications: gastroparesis and DN Therapy: insulin since 2011, Ozempic, and Farxiga GDM: 1986 and 1989 DKA: never Severe hypoglycemia: never Pancreatitis: never Pancreatic imaging: normal on 2020 [place] Other: goal is to d/c insulin, if possible Interval history: she brings her meter with her cbg's which I have reviewed today.  cbg varies from approx 100-150.  There is no trend throughout the day.  She takes meds as rx'ed.  pt states she feels well in general.  Denies nausea.   She will start preop diet for gastric bypass soon.   Past Medical History:  Diagnosis Date  . Diabetes mellitus without complication (HCC)    type II  . GERD (gastroesophageal reflux disease)   . Hyperlipidemia   . Insomnia   . Restless leg     Past Surgical History:  Procedure Laterality Date  . ABDOMINAL HYSTERECTOMY  1999   adhesions  . APPENDECTOMY    . CESAREAN SECTION  1987  . ECTOPIC PREGNANCY SURGERY  1992  . OTHER SURGICAL HISTORY  2004   "bowel obstruction-- open wound"  . TUBAL LIGATION  1991    Social History   Socioeconomic History  . Marital status: Married    Spouse name: Not on file  . Number of children: Not on file  . Years of education: Not on file  . Highest education level: Not on file  Occupational History  . Not on file  Social Needs  . Financial resource strain: Not on file  . Food insecurity    Worry: Not on file    Inability: Not on file  . Transportation needs    Medical: Not on file    Non-medical: Not on file  Tobacco Use  . Smoking status: Never Smoker  . Smokeless tobacco: Never Used  Substance and Sexual Activity  . Alcohol use: Yes    Comment: special occasions "couple times a year"  . Drug use: No  . Sexual activity: Yes  Lifestyle  . Physical activity   Days per week: Not on file    Minutes per session: Not on file  . Stress: Not on file  Relationships  . Social Musicianconnections    Talks on phone: Not on file    Gets together: Not on file    Attends religious service: Not on file    Active member of club or organization: Not on file    Attends meetings of clubs or organizations: Not on file    Relationship status: Not on file  . Intimate partner violence    Fear of current or ex partner: Not on file    Emotionally abused: Not on file    Physically abused: Not on file    Forced sexual activity: Not on file  Other Topics Concern  . Not on file  Social History Narrative   Works as a Systems developerspecial Ed Teacher for [employer]   Moved from [place], grew up in [place]   Married   Grown children (2 sons one in [place] he has 2 sons and a daughter, another son in [place] and has one child)    Current Outpatient Medications on File Prior to Visit  Medication Sig Dispense Refill  . atorvastatin (LIPITOR) 40 MG tablet Take 1 tablet (40 mg total) by mouth daily. 90 tablet  3  . dapagliflozin propanediol (FARXIGA) 10 MG TABS tablet Take 10 mg by mouth daily. 30 tablet 2  . glucose blood (ONETOUCH VERIO) test strip Test three times daily to check blood sugar.  DXE11.9 200 each 6  . ONE TOUCH LANCETS MISC Test as directed three times daily to check blood sugar.  DXE11.9 200 each 6  . rOPINIRole (REQUIP) 2 MG tablet TAKE 1/2 (ONE-HALF) TABLET BY MOUTH AT BEDTIME FOR 7 DAYS, THEN TAKE 1 AT BEDTIME (Patient taking differently: Take 2 mg by mouth at bedtime. ) 90 tablet 0  . Semaglutide, 1 MG/DOSE, (OZEMPIC, 1 MG/DOSE,) 2 MG/1.5ML SOPN Inject 1 mg into the skin once a week. (Patient taking differently: Inject 1 mg into the skin every Monday. ) 6 pen 3  . diphenhydrAMINE (BENADRYL) 50 MG tablet Take 100 mg by mouth at bedtime.     No current facility-administered medications on file prior to visit.     No Known Allergies  Family History  Problem Relation Age  of Onset  . Diabetes Mother        type II  . Hyperthyroidism Mother   . Diabetes Father        type II  . Diabetes Paternal Grandmother     BP 124/76 (BP Location: Left Arm, Patient Position: Sitting, Cuff Size: Large)   Pulse 92   Ht 5\' 3"  (1.6 m)   Wt 230 lb 6.4 oz (104.5 kg)   SpO2 97%   BMI 40.81 kg/m    Review of Systems She denies hypoglycemia.  She has gained weight    Objective:   Physical Exam VITAL SIGNS:  See vs page GENERAL: no distress Pulses: dorsalis pedis intact bilat.   MSK: no deformity of the feet.   CV: no leg edema.   Skin:  no ulcer on the feet.  normal color and temp on the feet.  Neuro: sensation is intact to touch on the feet.    Lab Results  Component Value Date   HGBA1C 7.3 (A) 08/07/2018       Assessment & Plan:  Insulin-requiring type 2 DM, with DN: this is the best control this pt should aim for, given this regimen, which does match insulin to her changing needs throughout the day Obesity, worse: she will start preop diet soon, so we'll reduce insulin.   Patient Instructions  Please reduce the Levemir to 15 units twice per day.   Please continue the same other diabetes medications.  check your blood sugar twice a day.  vary the time of day when you check, between before the 3 meals, and at bedtime.  also check if you have symptoms of your blood sugar being too high or too low.  please keep a record of the readings and bring it to your next appointment here (or you can bring the meter itself).  You can write it on any piece of paper.  please call us sooner if your blood sugar goes below 70, or if you have a lot of readings over 200. Please come back for a follow-up appointment in 1 month.

## 2018-08-07 NOTE — Patient Instructions (Addendum)
Please reduce the Levemir to 15 units twice per day.   Please continue the same other diabetes medications.  check your blood sugar twice a day.  vary the time of day when you check, between before the 3 meals, and at bedtime.  also check if you have symptoms of your blood sugar being too high or too low.  please keep a record of the readings and bring it to your next appointment here (or you can bring the meter itself).  You can write it on any piece of paper.  please call us sooner if your blood sugar goes below 70, or if you have a lot of readings over 200. Please come back for a follow-up appointment in 1 month.

## 2018-08-07 NOTE — Telephone Encounter (Signed)
With only 3 grams of fiber and 9 grams of sugar I would not recommend.   From: [email or web address]>  Sent: Tuesday, August 07, 2018 11:10 AM To: Sandie Ano [email or web address]> Subject: [External Email]label question  *Caution - External email - see footer for warnings*  I appreciate your time today. It was very helpful. I am now looking at a few labels for protein drinks and I found this one. Could you please look at this label for me? Would this ratio be ok?  I am looking at the sugars and thinking they may be too high, but I wanted to check.

## 2018-08-08 ENCOUNTER — Ambulatory Visit: Payer: Self-pay | Admitting: General Surgery

## 2018-08-08 NOTE — H&P (Signed)
Rai Sinagra Dewar Documented: 08/08/2018 10:50 AM Location: [facility] Patient #: [id number] DOB: 1960-08-12 Married / Language: Cleophus Molt / Race: White Female  History of Present Illness Randall Hiss M. Emeli Goguen MD; 08/08/2018 11:40 AM) The patient is a 58 year old female who presents for a bariatric surgery evaluation. She comes in for long-term follow-up regarding her severe obesity. She initially met with Dr. Excell Seltzer in March. She has completed her evaluation. She has a long-standing history of insulin-dependent diabetes mellitus. She has struggled with controlling her blood sugars however she has demonstrated significant improvement. They made several changes after her visit. Her A1c in May was sounded to 7.5. She has some occasional heartburn at night. It is controlled with over-the-counter medications. She denies any medical changes since she initially met with Dr. Excell Seltzer in March. She denies any trips to the emergency room hospital. Abdominal ultrasound showed no gallstones. Labs showed normal lipid panel. Low iron at 43. Normal CBC except for mildly elevated white count 11.7. Normal metabolic panel.  She denies any chest pain, chest pressure, chest tightness or angina. She denies any dyspnea on exertion. She denies any paroxysmal nocturnal dyspnea. She denies any prior history of blood clots. She denies any TIAs or amaurosis fugax. She denies any diarrhea or constipation. She denies any melena or hematochezia. She has had numerous abdominal surgeries mainly pelvic related. She had an emergency C-section I a tubal ligation. There she had an ectopic pregnancy which resulted in removal of an ovary. She then had pelvic pain and was found to have adhesions and underwent completion hysterectomy. We did discuss that because of her prior pelvic surgery she may have some small bowel adhesions and therefore have a slightly increased risk of enterotomy.  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   Problem List/Past Medical Randall Hiss M. Redmond Pulling, MD; 08/08/2018 11:40 AM) ELEVATED LDL CHOLESTEROL LEVEL (E78.00) OBESITY, MORBID, BMI 40.0-49.9 (E66.01) INSULIN DEPENDENT DIABETES MELLITUS (E11.9)  Past Surgical History Randall Hiss M. Redmond Pulling, MD; 08/08/2018 11:40 AM) Appendectomy Cesarean Section - 1 Hysterectomy (not due to cancer) - Complete  Diagnostic Studies History Randall Hiss M. Redmond Pulling, MD; 08/08/2018 11:40 AM) Colonoscopy 1-5 years ago Mammogram 1-3 years ago Pap Smear 1-5 years ago  Allergies Nance Pew, CMA; 08/08/2018 10:51 AM) No Known Allergies [04/17/2018]: No Known Drug Allergies [04/17/2018]: Allergies Reconciled  Medication History Nance Pew, CMA; 08/08/2018 10:53 AM) Levemir (100UNIT/ML Solution, Subcutaneous) Active. Atorvastatin Calcium (40MG Tablet, Oral) Active. Ciprofloxacin HCl (500MG Tablet, Oral) Active. Farxiga (10MG Tablet, Oral) Active. Fluconazole (150MG Tablet, Oral) Active. OneTouch Verio (In Vitro) Active. Ozempic (0.25 or 0.5MG/DOSE Soln Pen-inj, Subcutaneous) Active. rOPINIRole HCl (2MG Tablet, Oral) Active. Medications Reconciled  Social History Randall Hiss M. Redmond Pulling, MD; 08/08/2018 11:40 AM) Caffeine use Carbonated beverages. No drug use Tobacco use Never smoker.  Family History Randall Hiss M. Redmond Pulling, MD; 08/08/2018 11:40 AM) Diabetes Mellitus Father. Heart Disease Father. Thyroid problems Mother.  Pregnancy / Birth History Randall Hiss M. Redmond Pulling, MD; 08/08/2018 11:40 AM) Age at menarche 69 years. Gravida 3 Maternal age 7-25 Para 2  Other Problems Randall Hiss M. Redmond Pulling, MD; 08/08/2018 11:40 AM) Bladder Problems Depression GERD WITHOUT ESOPHAGITIS (K21.9) Diabetes Mellitus HISTORY OF HIGH CHOLESTEROL (Z86.39)     Review of Systems Randall Hiss M. Ananda Sitzer MD; 08/08/2018 11:37 AM) General Not Present- Appetite Loss, Chills, Fatigue, Fever, Night Sweats, Weight Gain and Weight Loss. Skin Not Present-  Change in Wart/Mole, Dryness, Hives, Jaundice, New Lesions, Non-Healing Wounds, Rash and Ulcer. HEENT Not Present- Earache,  Hearing Loss, Hoarseness, Nose Bleed, Oral Ulcers, Ringing in the Ears, Seasonal Allergies, Sinus Pain, Sore Throat, Visual Disturbances, Wears glasses/contact lenses and Yellow Eyes. Respiratory Not Present- Bloody sputum, Chronic Cough, Difficulty Breathing, Snoring and Wheezing. Breast Not Present- Breast Mass, Breast Pain, Nipple Discharge and Skin Changes. Cardiovascular Not Present- Chest Pain, Difficulty Breathing Lying Down, Leg Cramps, Palpitations, Rapid Heart Rate, Shortness of Breath and Swelling of Extremities. Gastrointestinal Not Present- Abdominal Pain, Bloating, Bloody Stool, Change in Bowel Habits, Chronic diarrhea, Constipation, Difficulty Swallowing, Excessive gas, Gets full quickly at meals, Hemorrhoids, Indigestion, Nausea, Rectal Pain and Vomiting. Female Genitourinary Present- Frequency and Nocturia. Not Present- Painful Urination, Pelvic Pain and Urgency. Musculoskeletal Not Present- Back Pain, Joint Pain, Joint Stiffness, Muscle Pain, Muscle Weakness and Swelling of Extremities. Neurological Not Present- Decreased Memory, Fainting, Headaches, Numbness, Seizures, Tingling, Tremor, Trouble walking and Weakness. Psychiatric Not Present- Anxiety, Bipolar, Change in Sleep Pattern, Depression, Fearful and Frequent crying. Endocrine Not Present- Cold Intolerance, Excessive Hunger, Hair Changes, Heat Intolerance, Hot flashes and New Diabetes. Hematology Not Present- Blood Thinners, Easy Bruising, Excessive bleeding, Gland problems, HIV and Persistent Infections.  Vitals (Sabrina Canty CMA; 08/08/2018 10:53 AM) 08/08/2018 10:53 AM Weight: 228.5 lb Height: 63in Neck: 15in Body Surface Area: 2.05 m Body Mass Index: 40.48 kg/m  Temp.: 98.36F(Oral)  Pulse: 97 (Regular)  Resp.: 16 (Unlabored)  P.OX: 97% (Room air) BP: 110/62 (Sitting, Left Arm,  Standard)        Physical Exam Randall Hiss M. Brigham Cobbins MD; 08/08/2018 11:36 AM)  General Mental Status-Alert. General Appearance-Consistent with stated age. Hydration-Well hydrated. Voice-Normal.  Head and Neck Head-normocephalic, atraumatic with no lesions or palpable masses. Trachea-midline. Thyroid Gland Characteristics - normal size and consistency.  Eye Eyeball - Bilateral-Extraocular movements intact. Sclera/Conjunctiva - Bilateral-No scleral icterus.  ENMT Ears Pinna - Bilateral - no bony growth in lateral aspect of ear canal, no cyanosis. Nose and Sinuses External Inspection of the Nose - symmetric, no deformities observed.  Chest and Lung Exam Chest and lung exam reveals -quiet, even and easy respiratory effort with no use of accessory muscles and on auscultation, normal breath sounds, no adventitious sounds and normal vocal resonance. Inspection Chest Wall - Normal. Back - normal.  Breast - Did not examine.  Cardiovascular Cardiovascular examination reveals -normal heart sounds, regular rate and rhythm with no murmurs and normal pedal pulses bilaterally.  Abdomen Inspection Inspection of the abdomen reveals - No Hernias. Skin - Scar - Note: old c/s scar; mild rash. Palpation/Percussion Palpation and Percussion of the abdomen reveal - Soft, Non Tender, No Rebound tenderness, No Rigidity (guarding) and No hepatosplenomegaly. Auscultation Auscultation of the abdomen reveals - Bowel sounds normal.  Peripheral Vascular Upper Extremity Palpation - Pulses bilaterally normal.  Neurologic Neurologic evaluation reveals -alert and oriented x 3 with no impairment of recent or remote memory. Mental Status-Normal.  Neuropsychiatric The patient's mood and affect are described as -normal. Judgment and Insight-insight is appropriate concerning matters relevant to self.  Musculoskeletal Normal Exam - Left-Upper Extremity Strength Normal and  Lower Extremity Strength Normal. Normal Exam - Right-Upper Extremity Strength Normal and Lower Extremity Strength Normal.  Lymphatic Head & Neck  General Head & Neck Lymphatics: Bilateral - Description - Normal. Axillary - Did not examine. Femoral & Inguinal - Did not examine.    Assessment & Plan Randall Hiss M. Tony Granquist MD; 08/08/2018 11:40 AM)  OBESITY, MORBID, BMI 40.0-49.9 (E66.01) Impression: The patient meets weight loss surgery criteria. I think the patient would be an acceptable candidate for  Laparoscopic Roux-en-Y Gastric bypass.  We discussed laparoscopic Roux-en-Y gastric bypass. We discussed the preoperative, operative and postoperative process. Using diagrams, I explained the surgery in detail including the performance of an EGD near the end of the surgery. We discussed the typical hospital course including a 2-3 day stay baring any complications. The patient was given educational material. I quoted the patient that they can expect to lose 50-70% of their excess weight with the gastric bypass. We did discuss the possibility of weight regain several years after the procedure.  We discussed the risk and benefits of surgery including but not limited to anesthesia risk, bleeding, infection, anastomotic edema requiring a few additional days in the hospital, postop nausea, possible conversion to open procedure, blood clot formation, anastomotic leak, anastomotic stricture, ulcer formation, death, respiratory complications, intestinal blockage, internal hernia, gallstone formation, vitamin and nutritional deficiencies, hair loss, weight regain injury to surrounding structures, failure to lose weight and mood changes.  We discussed that before and after surgery that there would be an alteration in their diet. I explained that we have put them on a diet 2 weeks before surgery. I also explained that they would be on a liquid diet for 2 weeks after surgery. We discussed that they would have to avoid  certain foods such as sugar after surgery. We discussed the importance of physical activity as well as compliance with our dietary and supplement recommendations and routine follow-up.  We reviewed her preoperative workup. We discussed the typical quirks that we see after surgery. We discussed the typical postoperative recovery.  Current Plans Pt Education - EMW_preopbariatric  ELEVATED LDL CHOLESTEROL LEVEL (E78.00)   INSULIN DEPENDENT DIABETES MELLITUS (E11.9) Impression: I congratulated her on her improvement in her diabetes control. She has a plan in place for postoperative diabetes control with her endocrinologist Dr. Loanne Drilling   GERD WITHOUT ESOPHAGITIS (K21.9)   HISTORY OF HIGH CHOLESTEROL (Z86.39)  Leighton Ruff. Redmond Pulling, MD, FACS General, Bariatric, & Minimally Invasive Surgery [facility], [place]

## 2018-08-08 NOTE — H&P (View-Only) (Signed)
Lisa [name] Documented: 08/08/2018 10:50 AM Location: [facility] Patient #: [id number] DOB: 1960/11/06 Married / Language: Cleophus Molt / Race: White Female  History of Present Illness Randall Hiss M. Kingsley Herandez MD; 08/08/2018 11:40 AM) The patient is a 58 year old female who presents for a bariatric surgery evaluation. She comes in for long-term follow-up regarding her severe obesity. She initially met with Dr. Excell Seltzer in March. She has completed her evaluation. She has a long-standing history of insulin-dependent diabetes mellitus. She has struggled with controlling her blood sugars however she has demonstrated significant improvement. They made several changes after her visit. Her A1c in May was sounded to 7.5. She has some occasional heartburn at night. It is controlled with over-the-counter medications. She denies any medical changes since she initially met with Dr. Excell Seltzer in March. She denies any trips to the emergency room hospital. Abdominal ultrasound showed no gallstones. Labs showed normal lipid panel. Low iron at 43. Normal CBC except for mildly elevated white count 11.7. Normal metabolic panel.  She denies any chest pain, chest pressure, chest tightness or angina. She denies any dyspnea on exertion. She denies any paroxysmal nocturnal dyspnea. She denies any prior history of blood clots. She denies any TIAs or amaurosis fugax. She denies any diarrhea or constipation. She denies any melena or hematochezia. She has had numerous abdominal surgeries mainly pelvic related. She had an emergency C-section I a tubal ligation. There she had an ectopic pregnancy which resulted in removal of an ovary. She then had pelvic pain and was found to have adhesions and underwent completion hysterectomy. We did discuss that because of her prior pelvic surgery she may have some small bowel adhesions and therefore have a slightly increased risk of enterotomy.  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   Problem List/Past Medical Randall Hiss M. Redmond Pulling, MD; 08/08/2018 11:40 AM) ELEVATED LDL CHOLESTEROL LEVEL (E78.00) OBESITY, MORBID, BMI 40.0-49.9 (E66.01) INSULIN DEPENDENT DIABETES MELLITUS (E11.9)  Past Surgical History Randall Hiss M. Redmond Pulling, MD; 08/08/2018 11:40 AM) Appendectomy Cesarean Section - 1 Hysterectomy (not due to cancer) - Complete  Diagnostic Studies History Randall Hiss M. Redmond Pulling, MD; 08/08/2018 11:40 AM) Colonoscopy 1-5 years ago Mammogram 1-3 years ago Pap Smear 1-5 years ago  Allergies Nance Pew, CMA; 08/08/2018 10:51 AM) No Known Allergies [04/17/2018]: No Known Drug Allergies [04/17/2018]: Allergies Reconciled  Medication History Nance Pew, CMA; 08/08/2018 10:53 AM) Levemir (100UNIT/ML Solution, Subcutaneous) Active. Atorvastatin Calcium (40MG Tablet, Oral) Active. Ciprofloxacin HCl (500MG Tablet, Oral) Active. Farxiga (10MG Tablet, Oral) Active. Fluconazole (150MG Tablet, Oral) Active. OneTouch Verio (In Vitro) Active. Ozempic (0.25 or 0.5MG/DOSE Soln Pen-inj, Subcutaneous) Active. rOPINIRole HCl (2MG Tablet, Oral) Active. Medications Reconciled  Social History Randall Hiss M. Redmond Pulling, MD; 08/08/2018 11:40 AM) Caffeine use Carbonated beverages. No drug use Tobacco use Never smoker.  Family History Randall Hiss M. Redmond Pulling, MD; 08/08/2018 11:40 AM) Diabetes Mellitus Father. Heart Disease Father. Thyroid problems Mother.  Pregnancy / Birth History Randall Hiss M. Redmond Pulling, MD; 08/08/2018 11:40 AM) Age at menarche 36 years. Gravida 3 Maternal age 69-25 Para 2  Other Problems Randall Hiss M. Redmond Pulling, MD; 08/08/2018 11:40 AM) Bladder Problems Depression GERD WITHOUT ESOPHAGITIS (K21.9) Diabetes Mellitus HISTORY OF HIGH CHOLESTEROL (Z86.39)     Review of Systems Randall Hiss M. Andren Bethea MD; 08/08/2018 11:37 AM) General Not Present- Appetite Loss, Chills, Fatigue, Fever, Night Sweats, Weight Gain and Weight Loss. Skin Not Present-  Change in Wart/Mole, Dryness, Hives, Jaundice, New Lesions, Non-Healing Wounds, Rash and Ulcer. HEENT Not Present- Earache,  Hearing Loss, Hoarseness, Nose Bleed, Oral Ulcers, Ringing in the Ears, Seasonal Allergies, Sinus Pain, Sore Throat, Visual Disturbances, Wears glasses/contact lenses and Yellow Eyes. Respiratory Not Present- Bloody sputum, Chronic Cough, Difficulty Breathing, Snoring and Wheezing. Breast Not Present- Breast Mass, Breast Pain, Nipple Discharge and Skin Changes. Cardiovascular Not Present- Chest Pain, Difficulty Breathing Lying Down, Leg Cramps, Palpitations, Rapid Heart Rate, Shortness of Breath and Swelling of Extremities. Gastrointestinal Not Present- Abdominal Pain, Bloating, Bloody Stool, Change in Bowel Habits, Chronic diarrhea, Constipation, Difficulty Swallowing, Excessive gas, Gets full quickly at meals, Hemorrhoids, Indigestion, Nausea, Rectal Pain and Vomiting. Female Genitourinary Present- Frequency and Nocturia. Not Present- Painful Urination, Pelvic Pain and Urgency. Musculoskeletal Not Present- Back Pain, Joint Pain, Joint Stiffness, Muscle Pain, Muscle Weakness and Swelling of Extremities. Neurological Not Present- Decreased Memory, Fainting, Headaches, Numbness, Seizures, Tingling, Tremor, Trouble walking and Weakness. Psychiatric Not Present- Anxiety, Bipolar, Change in Sleep Pattern, Depression, Fearful and Frequent crying. Endocrine Not Present- Cold Intolerance, Excessive Hunger, Hair Changes, Heat Intolerance, Hot flashes and New Diabetes. Hematology Not Present- Blood Thinners, Easy Bruising, Excessive bleeding, Gland problems, HIV and Persistent Infections.  Vitals (Sabrina Canty CMA; 08/08/2018 10:53 AM) 08/08/2018 10:53 AM Weight: 228.5 lb Height: 63in Neck: 15in Body Surface Area: 2.05 m Body Mass Index: 40.48 kg/m  Temp.: 98.75F(Oral)  Pulse: 97 (Regular)  Resp.: 16 (Unlabored)  P.OX: 97% (Room air) BP: 110/62 (Sitting, Left Arm,  Standard)        Physical Exam Randall Hiss M. Naol Ontiveros MD; 08/08/2018 11:36 AM)  General Mental Status-Alert. General Appearance-Consistent with stated age. Hydration-Well hydrated. Voice-Normal.  Head and Neck Head-normocephalic, atraumatic with no lesions or palpable masses. Trachea-midline. Thyroid Gland Characteristics - normal size and consistency.  Eye Eyeball - Bilateral-Extraocular movements intact. Sclera/Conjunctiva - Bilateral-No scleral icterus.  ENMT Ears Pinna - Bilateral - no bony growth in lateral aspect of ear canal, no cyanosis. Nose and Sinuses External Inspection of the Nose - symmetric, no deformities observed.  Chest and Lung Exam Chest and lung exam reveals -quiet, even and easy respiratory effort with no use of accessory muscles and on auscultation, normal breath sounds, no adventitious sounds and normal vocal resonance. Inspection Chest Wall - Normal. Back - normal.  Breast - Did not examine.  Cardiovascular Cardiovascular examination reveals -normal heart sounds, regular rate and rhythm with no murmurs and normal pedal pulses bilaterally.  Abdomen Inspection Inspection of the abdomen reveals - No Hernias. Skin - Scar - Note: old c/s scar; mild rash. Palpation/Percussion Palpation and Percussion of the abdomen reveal - Soft, Non Tender, No Rebound tenderness, No Rigidity (guarding) and No hepatosplenomegaly. Auscultation Auscultation of the abdomen reveals - Bowel sounds normal.  Peripheral Vascular Upper Extremity Palpation - Pulses bilaterally normal.  Neurologic Neurologic evaluation reveals -alert and oriented x 3 with no impairment of recent or remote memory. Mental Status-Normal.  Neuropsychiatric The patient's mood and affect are described as -normal. Judgment and Insight-insight is appropriate concerning matters relevant to self.  Musculoskeletal Normal Exam - Left-Upper Extremity Strength Normal and  Lower Extremity Strength Normal. Normal Exam - Right-Upper Extremity Strength Normal and Lower Extremity Strength Normal.  Lymphatic Head & Neck  General Head & Neck Lymphatics: Bilateral - Description - Normal. Axillary - Did not examine. Femoral & Inguinal - Did not examine.    Assessment & Plan Randall Hiss M. Kennard Fildes MD; 08/08/2018 11:40 AM)  OBESITY, MORBID, BMI 40.0-49.9 (E66.01) Impression: The patient meets weight loss surgery criteria. I think the patient would be an acceptable candidate for  Laparoscopic Roux-en-Y Gastric bypass.  We discussed laparoscopic Roux-en-Y gastric bypass. We discussed the preoperative, operative and postoperative process. Using diagrams, I explained the surgery in detail including the performance of an EGD near the end of the surgery. We discussed the typical hospital course including a 2-3 day stay baring any complications. The patient was given educational material. I quoted the patient that they can expect to lose 50-70% of their excess weight with the gastric bypass. We did discuss the possibility of weight regain several years after the procedure.  We discussed the risk and benefits of surgery including but not limited to anesthesia risk, bleeding, infection, anastomotic edema requiring a few additional days in the hospital, postop nausea, possible conversion to open procedure, blood clot formation, anastomotic leak, anastomotic stricture, ulcer formation, death, respiratory complications, intestinal blockage, internal hernia, gallstone formation, vitamin and nutritional deficiencies, hair loss, weight regain injury to surrounding structures, failure to lose weight and mood changes.  We discussed that before and after surgery that there would be an alteration in their diet. I explained that we have put them on a diet 2 weeks before surgery. I also explained that they would be on a liquid diet for 2 weeks after surgery. We discussed that they would have to avoid  certain foods such as sugar after surgery. We discussed the importance of physical activity as well as compliance with our dietary and supplement recommendations and routine follow-up.  We reviewed her preoperative workup. We discussed the typical quirks that we see after surgery. We discussed the typical postoperative recovery.  Current Plans Pt Education - EMW_preopbariatric  ELEVATED LDL CHOLESTEROL LEVEL (E78.00)   INSULIN DEPENDENT DIABETES MELLITUS (E11.9) Impression: I congratulated her on her improvement in her diabetes control. She has a plan in place for postoperative diabetes control with her endocrinologist Dr. Loanne Drilling   GERD WITHOUT ESOPHAGITIS (K21.9)   HISTORY OF HIGH CHOLESTEROL (Z86.39)  Leighton Ruff. Redmond Pulling, MD, FACS General, Bariatric, & Minimally Invasive Surgery [facility], [place]

## 2018-08-15 NOTE — Patient Instructions (Addendum)
YOU NEED TO HAVE A COVID 19 TEST ON 08-17-18  @ 11:10 AM, THIS TEST MUST BE DONE BEFORE SURGERY, COME TO [facility] ENTRANCE. ONCE YOUR COVID TEST IS COMPLETED, PLEASE BEGIN THE QUARANTINE INSTRUCTIONS AS OUTLINED IN YOUR HANDOUT.                Lisa [name]    Your procedure is scheduled on: 08-21-2018   Report to [facility] Main  Entrance    Report to [facility] at 530  AM     Call this number if you have problems the morning of surgery [phone]    Remember:  MORNING OF SURGERY DRINK:   DRINK 1 G2 drink BEFORE YOU LEAVE HOME, DRINK ALL OF THE  G2 DRINK AT ONE TIME.   NO SOLID FOOD AFTER 600 PM THE NIGHT BEFORE YOUR SURGERY. YOU MAY DRINK CLEAR FLUIDS. THE G2 DRINK YOU DRINK BEFORE YOU LEAVE HOME WILL BE THE LAST FLUIDS YOU DRINK BEFORE SURGERY. DRINK  G2 DRINK AT 42 AM    PAIN IS EXPECTED AFTER SURGERY AND WILL NOT BE COMPLETELY ELIMINATED. AMBULATION AND TYLENOL WILL HELP REDUCE INCISIONAL AND GAS PAIN. MOVEMENT IS KEY!  YOU ARE EXPECTED TO BE OUT OF BED WITHIN 4 HOURS OF ADMISSION TO YOUR PATIENT ROOM.  SITTING IN THE RECLINER THROUGHOUT THE DAY IS IMPORTANT FOR DRINKING FLUIDS AND MOVING GAS THROUGHOUT THE GI TRACT.  COMPRESSION STOCKINGS SHOULD BE WORN [place] UNLESS YOU ARE WALKING.   INCENTIVE SPIROMETER SHOULD BE USED EVERY HOUR WHILE AWAKE TO DECREASE POST-OPERATIVE COMPLICATIONS SUCH AS PNEUMONIA.  WHEN DISCHARGED HOME, IT IS IMPORTANT TO CONTINUE TO WALK EVERY HOUR AND USE THE INCENTIVE SPIROMETER EVERY HOUR.        CLEAR LIQUID DIET   Foods Allowed                                                                     Foods Excluded  Coffee and tea, regular and decaf                             liquids that you cannot  Plain Jell-O in any flavor                                             see through such as: Fruit ices (not with fruit pulp)                                     milk, soups, orange  juice  Iced Popsicles                                    All solid food Carbonated beverages, regular and diet  Cranberry, grape and apple juices Sports drinks like Gatorade Lightly seasoned clear broth or consume(fat free) Sugar, honey syrup  Sample Menu Breakfast                                Lunch                                     Supper Cranberry juice                    Beef broth                            Chicken broth Jell-O                                     Grape juice                           Apple juice Coffee or tea                        Jell-O                                      Popsicle                                                Coffee or tea                        Coffee or tea  _____________________________________________________________________     Take these medicines the morning of surgery with A SIP OF WATER: Atorvastatin (Lipitor)  PLEASE BRUSH YOUR TEETH THE MORNING OF SURGERY, AFTER BRUSHING NO ADDITIONAL WATER, GUM, CANDY OR MINT IN YOUR MOUTH              You may not have any metal on your body including hair pins and              piercings     Do not wear jewelry, make-up, lotions, powders or perfumes, deodorant               Do not wear nail polish.  Do not shave  48 hours prior to surgery.            Do not bring valuables to the hospital. [facility] IS NOT             RESPONSIBLE   FOR VALUABLES.  Contacts, dentures or bridgework may not be worn into surgery.       _____________________________________________________________________  How to Manage Your Diabetes  Before and After Surgery  Why is it important to control my blood sugar before and after surgery? . Improving blood sugar levels before and after surgery helps healing and can limit problems. . A way of improving blood sugar control is eating a healthy diet by: o  Eating less sugar and carbohydrates o  Increasing activity/exercise o  Talking with your doctor about reaching your blood sugar goals . High blood sugars (greater than 180 mg/dL) can raise your risk of infections and slow your recovery, so you will need to focus on controlling your diabetes during the weeks before surgery. . Make sure that the doctor who takes care of your diabetes knows about your planned surgery including the date and location.  How do I manage my blood sugar before surgery? . Check your blood sugar at least 4 times a day, starting 2 days before surgery, to make sure that the level is not too high or low. o Check your blood sugar the morning of your surgery when you wake up and every 2 hours until you get to the Short Stay unit. . If your blood sugar is less than 70 mg/dL, you will need to treat for low blood sugar: o Do not take insulin. o Treat a low blood sugar (less than 70 mg/dL) with  cup of clear juice (cranberry or apple), 4 glucose tablets, OR glucose gel. o Recheck blood sugar in 15 minutes after treatment (to make sure it is greater than 70 mg/dL). If your blood sugar is not greater than 70 mg/dL on recheck, call [phone] for further instructions. . Report your blood sugar to the short stay nurse when you get to Short Stay.  . If you are admitted to the hospital after surgery: o Your blood sugar will be checked by the staff and you will probably be given insulin after surgery (instead of oral diabetes medicines) to make sure you have good blood sugar levels. o The goal for blood sugar control after surgery is 80-180 mg/dL.   WHAT DO I DO ABOUT MY DIABETES MEDICATION?         TAKE OZEMPIC AS USUAL ON MONDAY            THE DAY BEFORE SURGERY DO NOT TAKE  FARXIGA   THE DAY OF SURGERY DO NOT TAKE FARXIGA.   THE DAY BEFORE SURGERY TAKE FULL MORNING LEVEMIR INSULIN DOSE (15 UNITS). HOWEVER, TAKE ONLY A 1/2 OF YOUR EVENING LEVEMIR INSULIN ( 7 UNITS).      THE MORNING OF SURGERY TAKE 1/2 DOSE OF LEVEMIR INSULIN (TAKE 7  UNITS)  T  Reviewed and Endorsed by [facility] Patient Education Committee, August 2015                        [facility] - Preparing for Surgery Before surgery, you can play an important role.  Because skin is not sterile, your skin needs to be as free of germs as possible.  You can reduce the number of germs on your skin by washing with CHG (chlorahexidine gluconate) soap before surgery.  CHG is an antiseptic cleaner which kills germs and bonds with the skin to continue killing germs even after washing. Please DO NOT use if you have an allergy to CHG or antibacterial soaps.  If your skin becomes reddened/irritated stop using the CHG and inform your nurse when you arrive at Short Stay. Do not shave (including legs and underarms) for at least 48 hours prior to the first CHG shower.  You may shave your face/neck. Please follow these instructions carefully:  1.  Shower with CHG Soap the night before surgery and the  morning of Surgery.  2.  If you choose to wash your hair, wash your hair first as usual with your  normal  shampoo.  3.  After  you shampoo, rinse your hair and body thoroughly to remove the  shampoo.                           4.  Use CHG as you would any other liquid soap.  You can apply chg directly  to the skin and wash                       Gently with a scrungie or clean washcloth.  5.  Apply the CHG Soap to your body ONLY FROM THE NECK DOWN.   Do not use on face/ open                           Wound or open sores. Avoid contact with eyes, ears mouth and genitals (private parts).                       Wash face,  Genitals (private parts) with your normal soap.             6.  Wash thoroughly, paying special attention to the area where your surgery  will be performed.  7.  Thoroughly rinse your body with warm water from the neck down.  8.  DO NOT shower/wash with your normal soap after using and rinsing off  the CHG Soap.                9.  Pat yourself dry with a clean towel.             10.  Wear clean pajamas.            11.  Place clean sheets on your bed the night of your first shower and do not  sleep with pets. Day of Surgery : Do not apply any lotions/deodorants the morning of surgery.  Please wear clean clothes to the hospital/surgery center.  FAILURE TO FOLLOW THESE INSTRUCTIONS MAY RESULT IN THE CANCELLATION OF YOUR SURGERY PATIENT SIGNATURE_________________________________  NURSE SIGNATURE__________________________________  ________________________________________________________________________   Rogelia MireIncentive Spirometer  An incentive spirometer is a tool that can help keep your lungs clear and active. This tool measures how well you are filling your lungs with each breath. Taking long deep breaths may help reverse or decrease the chance of developing breathing (pulmonary) problems (especially infection) following:  A long period of time when you are unable to move or be active. BEFORE THE PROCEDURE   If the spirometer includes an indicator to show your best effort, your nurse or respiratory therapist will set it to a desired goal.  If possible, sit up straight or lean slightly forward. Try not to slouch.  Hold the incentive spirometer in an upright position. INSTRUCTIONS FOR USE  1. Sit on the edge of your bed if possible, or sit up as far as you can in bed or on a chair. 2. Hold the incentive spirometer in an upright position. 3. Breathe out normally. 4. Place the mouthpiece in your mouth and seal your lips tightly around it. 5. Breathe in slowly and as deeply as possible, raising the piston or the ball toward the top of the column. 6. Hold your breath for 3-5 seconds or for as long as possible. Allow the piston or ball to fall to the bottom of the column. 7. Remove the mouthpiece from your mouth and breathe out normally. 8. Rest for a few  seconds and repeat Steps 1 through 7 at least 10 times every 1-2 hours when you are awake. Take your time and  take a few normal breaths between deep breaths. 9. The spirometer may include an indicator to show your best effort. Use the indicator as a goal to work toward during each repetition. 10. After each set of 10 deep breaths, practice coughing to be sure your lungs are clear. If you have an incision (the cut made at the time of surgery), support your incision when coughing by placing a pillow or rolled up towels firmly against it. Once you are able to get out of bed, walk around indoors and cough well. You may stop using the incentive spirometer when instructed by your caregiver.  RISKS AND COMPLICATIONS  Take your time so you do not get dizzy or light-headed.  If you are in pain, you may need to take or ask for pain medication before doing incentive spirometry. It is harder to take a deep breath if you are having pain. AFTER USE  Rest and breathe slowly and easily.  It can be helpful to keep track of a log of your progress. Your caregiver can provide you with a simple table to help with this. If you are using the spirometer at home, follow these instructions: SEEK MEDICAL CARE IF:   You are having difficultly using the spirometer.  You have trouble using the spirometer as often as instructed.  Your pain medication is not giving enough relief while using the spirometer.  You develop fever of 100.5 F (38.1 C) or higher. SEEK IMMEDIATE MEDICAL CARE IF:   You cough up bloody sputum that had not been present before.  You develop fever of 102 F (38.9 C) or greater.  You develop worsening pain at or near the incision site. MAKE SURE YOU:   Understand these instructions.  Will watch your condition.  Will get help right away if you are not doing well or get worse. Document Released: 06/06/2006 Document Revised: 04/18/2011 Document Reviewed: 08/07/2006 ExitCare Patient Information 2014 ExitCare,  [place].   ________________________________________________________________________  WHAT IS A BLOOD TRANSFUSION? Blood Transfusion Information  A transfusion is the replacement of blood or some of its parts. Blood is made up of multiple cells which provide different functions.  Red blood cells carry oxygen and are used for blood loss replacement.  White blood cells fight against infection.  Platelets control bleeding.  Plasma helps clot blood.  Other blood products are available for specialized needs, such as hemophilia or other clotting disorders. BEFORE THE TRANSFUSION  Who gives blood for transfusions?   Healthy volunteers who are fully evaluated to make sure their blood is safe. This is blood bank blood. Transfusion therapy is the safest it has ever been in the practice of medicine. Before blood is taken from a donor, a complete history is taken to make sure that person has no history of diseases nor engages in risky social behavior (examples are intravenous drug use or sexual activity with multiple partners). The donor's travel history is screened to minimize risk of transmitting infections, such as malaria. The donated blood is tested for signs of infectious diseases, such as HIV and hepatitis. The blood is then tested to be sure it is compatible with you in order to minimize the chance of a transfusion reaction. If you or a relative donates blood, this is often done in anticipation of surgery and is not appropriate for emergency situations. It takes many days to process  the donated blood. RISKS AND COMPLICATIONS Although transfusion therapy is very safe and saves many lives, the main dangers of transfusion include:   Getting an infectious disease.  Developing a transfusion reaction. This is an allergic reaction to something in the blood you were given. Every precaution is taken to prevent this. The decision to have a blood transfusion has been considered carefully by your caregiver  before blood is given. Blood is not given unless the benefits outweigh the risks. AFTER THE TRANSFUSION  Right after receiving a blood transfusion, you will usually feel much better and more energetic. This is especially true if your red blood cells have gotten low (anemic). The transfusion raises the level of the red blood cells which carry oxygen, and this usually causes an energy increase.  The nurse administering the transfusion will monitor you carefully for complications. HOME CARE INSTRUCTIONS  No special instructions are needed after a transfusion. You may find your energy is better. Speak with your caregiver about any limitations on activity for underlying diseases you may have. SEEK MEDICAL CARE IF:   Your condition is not improving after your transfusion.  You develop redness or irritation at the intravenous (IV) site. SEEK IMMEDIATE MEDICAL CARE IF:  Any of the following symptoms occur over the next 12 hours:  Shaking chills.  You have a temperature by mouth above 102 F (38.9 C), not controlled by medicine.  Chest, back, or muscle pain.  People around you feel you are not acting correctly or are confused.  Shortness of breath or difficulty breathing.  Dizziness and fainting.  You get a rash or develop hives.  You have a decrease in urine output.  Your urine turns a dark color or changes to pink, red, or brown. Any of the following symptoms occur over the next 10 days:  You have a temperature by mouth above 102 F (38.9 C), not controlled by medicine.  Shortness of breath.  Weakness after normal activity.  The white part of the eye turns yellow (jaundice).  You have a decrease in the amount of urine or are urinating less often.  Your urine turns a dark color or changes to pink, red, or brown. Document Released: 01/22/2000 Document Revised: 04/18/2011 Document Reviewed: 09/10/2007 [facility] Patient Information 2014 [place],  [place].  _______________________________________________________________________

## 2018-08-15 NOTE — Progress Notes (Signed)
[facility] ENDOCRIN[facility] DR Loanne Drilling 08-07-18 Epic EKG 3-18 20 Epic CHEST XRAY 04-25-18 EPIC

## 2018-08-17 ENCOUNTER — Encounter (HOSPITAL_COMMUNITY): Payer: Self-pay

## 2018-08-17 ENCOUNTER — Other Ambulatory Visit: Payer: Self-pay

## 2018-08-17 ENCOUNTER — Encounter (HOSPITAL_COMMUNITY)
Admission: RE | Admit: 2018-08-17 | Discharge: 2018-08-17 | Disposition: A | Discharge status: Home / Self Care | Payer: BC Managed Care – PPO | Source: Ambulatory Visit | Attending: General Surgery | Admitting: General Surgery

## 2018-08-17 ENCOUNTER — Other Ambulatory Visit (HOSPITAL_COMMUNITY)
Admission: RE | Admit: 2018-08-17 | Discharge: 2018-08-17 | Disposition: A | Discharge status: Home / Self Care | Payer: BC Managed Care – PPO | Source: Ambulatory Visit | Attending: General Surgery | Admitting: General Surgery

## 2018-08-17 DIAGNOSIS — Z01812 Encounter for preprocedural laboratory examination: Secondary | ICD-10-CM | POA: Diagnosis not present

## 2018-08-17 DIAGNOSIS — Z1159 Encounter for screening for other viral diseases: Secondary | ICD-10-CM | POA: Insufficient documentation

## 2018-08-17 DIAGNOSIS — Z6839 Body mass index (BMI) 39.0-39.9, adult: Secondary | ICD-10-CM | POA: Insufficient documentation

## 2018-08-17 LAB — CBC WITH DIFFERENTIAL/PLATELET
Abs Immature Granulocytes: 0.04 10*3/uL (ref 0.00–0.07)
Basophils Absolute: 0.1 10*3/uL (ref 0.0–0.1)
Basophils Relative: 1 %
Eosinophils Absolute: 0.1 10*3/uL (ref 0.0–0.5)
Eosinophils Relative: 1 %
HCT: 45.7 % (ref 36.0–46.0)
Hemoglobin: 14.5 g/dL (ref 12.0–15.0)
Immature Granulocytes: 1 %
Lymphocytes Relative: 32 %
Lymphs Abs: 2.7 10*3/uL (ref 0.7–4.0)
MCH: 27.1 pg (ref 26.0–34.0)
MCHC: 31.7 g/dL (ref 30.0–36.0)
MCV: 85.4 fL (ref 80.0–100.0)
Monocytes Absolute: 0.6 10*3/uL (ref 0.1–1.0)
Monocytes Relative: 7 %
Neutro Abs: 4.9 10*3/uL (ref 1.7–7.7)
Neutrophils Relative %: 58 %
Platelets: 282 10*3/uL (ref 150–400)
RBC: 5.35 MIL/uL — ABNORMAL HIGH (ref 3.87–5.11)
RDW: 14.6 % (ref 11.5–15.5)
WBC: 8.4 10*3/uL (ref 4.0–10.5)
nRBC: 0 % (ref 0.0–0.2)

## 2018-08-17 LAB — COMPREHENSIVE METABOLIC PANEL
ALT: 24 U/L (ref 0–44)
AST: 18 U/L (ref 15–41)
Albumin: 4.2 g/dL (ref 3.5–5.0)
Alkaline Phosphatase: 73 U/L (ref 38–126)
Anion gap: 10 (ref 5–15)
BUN: 14 mg/dL (ref 6–20)
CO2: 24 mmol/L (ref 22–32)
Calcium: 9.1 mg/dL (ref 8.9–10.3)
Chloride: 106 mmol/L (ref 98–111)
Creatinine, Ser: 0.48 mg/dL (ref 0.44–1.00)
GFR calc Af Amer: >60 mL/min (ref >60)
GFR calc non Af Amer: >60 mL/min (ref >60)
Glucose, Bld: 93 mg/dL (ref 70–99)
Potassium: 4.2 mmol/L (ref 3.5–5.1)
Sodium: 140 mmol/L (ref 135–145)
Total Bilirubin: 0.8 mg/dL (ref 0.3–1.2)
Total Protein: 7.5 g/dL (ref 6.5–8.1)

## 2018-08-17 LAB — GLUCOSE, CAPILLARY: Glucose-Capillary: 100 mg/dL — ABNORMAL HIGH (ref 70–99)

## 2018-08-17 LAB — ABO/RH: ABO/RH(D): A POS

## 2018-08-17 LAB — SARS CORONAVIRUS 2 (TAT 6-24 HRS): SARS Coronavirus 2: NEGATIVE

## 2018-08-18 LAB — HEMOGLOBIN A1C
Hgb A1c MFr Bld: 6.9 % — ABNORMAL HIGH (ref 4.8–5.6)
Mean Plasma Glucose: 151 mg/dL

## 2018-08-20 MED ORDER — BUPIVACAINE LIPOSOME 1.3 % IJ SUSP
20.0000 mL | Freq: Once | INTRAMUSCULAR | Status: DC
  Filled 2018-08-20: qty 20

## 2018-08-20 NOTE — Anesthesia Preprocedure Evaluation (Addendum)
Anesthesia Evaluation  Patient identified by MRN, date of birth, ID band Patient awake    Reviewed: Allergy & Precautions, NPO status , Patient's Chart, lab work & pertinent test results  History of Anesthesia Complications Negative for: history of anesthetic complications  Airway Mallampati: II  TM Distance: >3 FB Neck ROM: Full    Dental no notable dental hx. (+) Dental Advisory Given   Pulmonary neg pulmonary ROS,    Pulmonary exam normal        Cardiovascular negative cardio ROS Normal cardiovascular exam     Neuro/Psych PSYCHIATRIC DISORDERS Anxiety Depression negative neurological ROS     GI/Hepatic Neg liver ROS, GERD  ,  Endo/Other  diabetesMorbid obesity  Renal/GU negative Renal ROS     Musculoskeletal negative musculoskeletal ROS (+)   Abdominal   Peds  Hematology negative hematology ROS (+)   Anesthesia Other Findings Day of surgery medications reviewed with the patient.  Reproductive/Obstetrics                            Anesthesia Physical Anesthesia Plan  ASA: III  Anesthesia Plan: General   Post-op Pain Management:    Induction: Intravenous  PONV Risk Score and Plan: 4 or greater and Ondansetron, Dexamethasone, Scopolamine patch - Pre-op and Diphenhydramine  Airway Management Planned: Oral ETT  Additional Equipment:   Intra-op Plan:   Post-operative Plan: Extubation in OR  Informed Consent: I have reviewed the patients History and Physical, chart, labs and discussed the procedure including the risks, benefits and alternatives for the proposed anesthesia with the patient or authorized representative who has indicated his/her understanding and acceptance.     Dental advisory given  Plan Discussed with: CRNA and Anesthesiologist  Anesthesia Plan Comments:        Anesthesia Quick Evaluation

## 2018-08-21 ENCOUNTER — Encounter (HOSPITAL_COMMUNITY)
Admission: RE | Disposition: A | Discharge status: Home / Self Care | Payer: Self-pay | Source: Home / Self Care | Attending: General Surgery

## 2018-08-21 ENCOUNTER — Other Ambulatory Visit: Payer: Self-pay

## 2018-08-21 ENCOUNTER — Inpatient Hospital Stay (HOSPITAL_COMMUNITY): Payer: BC Managed Care – PPO | Admitting: Anesthesiology

## 2018-08-21 ENCOUNTER — Inpatient Hospital Stay (HOSPITAL_COMMUNITY)
Admission: RE | Admit: 2018-08-21 | Discharge: 2018-08-22 | DRG: 621 | Disposition: A | Discharge status: Home / Self Care | Payer: BC Managed Care – PPO | Attending: General Surgery | Admitting: General Surgery

## 2018-08-21 ENCOUNTER — Encounter (HOSPITAL_COMMUNITY): Payer: Self-pay | Admitting: *Deleted

## 2018-08-21 ENCOUNTER — Inpatient Hospital Stay (HOSPITAL_COMMUNITY): Payer: BC Managed Care – PPO | Admitting: Physician Assistant

## 2018-08-21 DIAGNOSIS — E119 Type 2 diabetes mellitus without complications: Secondary | ICD-10-CM | POA: Diagnosis present

## 2018-08-21 DIAGNOSIS — K219 Gastro-esophageal reflux disease without esophagitis: Secondary | ICD-10-CM | POA: Diagnosis present

## 2018-08-21 DIAGNOSIS — E7800 Pure hypercholesterolemia, unspecified: Secondary | ICD-10-CM | POA: Diagnosis present

## 2018-08-21 DIAGNOSIS — E1169 Type 2 diabetes mellitus with other specified complication: Secondary | ICD-10-CM | POA: Diagnosis present

## 2018-08-21 DIAGNOSIS — Z8249 Family history of ischemic heart disease and other diseases of the circulatory system: Secondary | ICD-10-CM | POA: Diagnosis not present

## 2018-08-21 DIAGNOSIS — K660 Peritoneal adhesions (postprocedural) (postinfection): Secondary | ICD-10-CM | POA: Diagnosis present

## 2018-08-21 DIAGNOSIS — E785 Hyperlipidemia, unspecified: Secondary | ICD-10-CM | POA: Diagnosis present

## 2018-08-21 DIAGNOSIS — Z90710 Acquired absence of both cervix and uterus: Secondary | ICD-10-CM

## 2018-08-21 DIAGNOSIS — Z794 Long term (current) use of insulin: Secondary | ICD-10-CM

## 2018-08-21 DIAGNOSIS — F329 Major depressive disorder, single episode, unspecified: Secondary | ICD-10-CM | POA: Diagnosis present

## 2018-08-21 DIAGNOSIS — Z79899 Other long term (current) drug therapy: Secondary | ICD-10-CM | POA: Diagnosis not present

## 2018-08-21 DIAGNOSIS — Z833 Family history of diabetes mellitus: Secondary | ICD-10-CM | POA: Diagnosis not present

## 2018-08-21 DIAGNOSIS — Z9884 Bariatric surgery status: Secondary | ICD-10-CM

## 2018-08-21 DIAGNOSIS — K760 Fatty (change of) liver, not elsewhere classified: Secondary | ICD-10-CM | POA: Diagnosis present

## 2018-08-21 DIAGNOSIS — E669 Obesity, unspecified: Secondary | ICD-10-CM | POA: Diagnosis present

## 2018-08-21 DIAGNOSIS — Z6841 Body Mass Index (BMI) 40.0 and over, adult: Secondary | ICD-10-CM | POA: Diagnosis not present

## 2018-08-21 HISTORY — PX: GASTRIC ROUX-EN-Y: SHX5262

## 2018-08-21 LAB — GLUCOSE, CAPILLARY
Glucose-Capillary: 106 mg/dL — ABNORMAL HIGH (ref 70–99)
Glucose-Capillary: 158 mg/dL — ABNORMAL HIGH (ref 70–99)
Glucose-Capillary: 172 mg/dL — ABNORMAL HIGH (ref 70–99)
Glucose-Capillary: 141 mg/dL — ABNORMAL HIGH (ref 70–99)

## 2018-08-21 LAB — TYPE AND SCREEN
ABO/RH(D): A POS
Antibody Screen: NEGATIVE

## 2018-08-21 LAB — HEMOGLOBIN AND HEMATOCRIT, BLOOD
HCT: 46.4 % — ABNORMAL HIGH (ref 36.0–46.0)
Hemoglobin: 14.3 g/dL (ref 12.0–15.0)

## 2018-08-21 SURGERY — LAPAROSCOPIC ROUX-EN-Y GASTRIC BYPASS WITH UPPER ENDOSCOPY
Anesthesia: General | Site: Abdomen

## 2018-08-21 MED ORDER — ONDANSETRON HCL 4 MG/2ML IJ SOLN: 4.0000 mg | Freq: Four times a day (QID) | INTRAMUSCULAR | Status: DC | PRN

## 2018-08-21 MED ORDER — ACETAMINOPHEN 500 MG PO TABS
1000.0000 mg | ORAL_TABLET | Freq: Three times a day (TID) | ORAL | Status: DC
  Administered 2018-08-22: 1000 mg via ORAL
  Filled 2018-08-21: qty 2

## 2018-08-21 MED ORDER — LACTATED RINGERS IV SOLN
INTRAVENOUS | Status: DC
  Administered 2018-08-21 (×3): via INTRAVENOUS

## 2018-08-21 MED ORDER — DEXAMETHASONE SODIUM PHOSPHATE 10 MG/ML IJ SOLN
INTRAMUSCULAR | Status: DC | PRN
  Administered 2018-08-21: 8 mg via INTRAVENOUS

## 2018-08-21 MED ORDER — ROPINIROLE HCL 1 MG PO TABS
2.0000 mg | ORAL_TABLET | Freq: Every day | ORAL | Status: DC
  Administered 2018-08-21: 2 mg via ORAL
  Filled 2018-08-21: qty 2

## 2018-08-21 MED ORDER — PROMETHAZINE HCL 25 MG/ML IJ SOLN
6.2500 mg | INTRAMUSCULAR | Status: DC | PRN
  Administered 2018-08-21: 12.5 mg via INTRAVENOUS

## 2018-08-21 MED ORDER — PROMETHAZINE HCL 25 MG/ML IJ SOLN: 12.5000 mg | Freq: Four times a day (QID) | INTRAMUSCULAR | Status: DC | PRN

## 2018-08-21 MED ORDER — SUGAMMADEX SODIUM 500 MG/5ML IV SOLN
INTRAVENOUS | Status: AC
  Filled 2018-08-21: qty 5

## 2018-08-21 MED ORDER — GABAPENTIN 100 MG PO CAPS
200.0000 mg | ORAL_CAPSULE | Freq: Two times a day (BID) | ORAL | Status: DC
  Administered 2018-08-21 – 2018-08-22 (×2): 200 mg via ORAL
  Filled 2018-08-21 (×2): qty 2

## 2018-08-21 MED ORDER — ROCURONIUM BROMIDE 10 MG/ML (PF) SYRINGE
PREFILLED_SYRINGE | INTRAVENOUS | Status: AC
  Filled 2018-08-21: qty 20

## 2018-08-21 MED ORDER — ENSURE MAX PROTEIN PO LIQD
2.0000 [oz_av] | ORAL | Status: DC
  Administered 2018-08-22 (×6): 2 [oz_av] via ORAL

## 2018-08-21 MED ORDER — ACETAMINOPHEN 160 MG/5ML PO SOLN
1000.0000 mg | Freq: Three times a day (TID) | ORAL | Status: DC
  Administered 2018-08-21 – 2018-08-22 (×2): 1000 mg via ORAL
  Filled 2018-08-21 (×2): qty 40.6

## 2018-08-21 MED ORDER — CHLORHEXIDINE GLUCONATE 4 % EX LIQD: 60.0000 mL | Freq: Once | CUTANEOUS | Status: DC

## 2018-08-21 MED ORDER — ONDANSETRON HCL 4 MG/2ML IJ SOLN
INTRAMUSCULAR | Status: DC | PRN
  Administered 2018-08-21: 4 mg via INTRAVENOUS

## 2018-08-21 MED ORDER — SCOPOLAMINE 1 MG/3DAYS TD PT72
1.0000 | MEDICATED_PATCH | TRANSDERMAL | Status: DC
  Filled 2018-08-21: qty 1

## 2018-08-21 MED ORDER — LIDOCAINE 2% (20 MG/ML) 5 ML SYRINGE
INTRAMUSCULAR | Status: DC | PRN
  Administered 2018-08-21: 60 mg via INTRAVENOUS

## 2018-08-21 MED ORDER — PROPOFOL 10 MG/ML IV BOLUS
INTRAVENOUS | Status: AC
  Filled 2018-08-21: qty 20

## 2018-08-21 MED ORDER — DEXAMETHASONE SODIUM PHOSPHATE 10 MG/ML IJ SOLN
INTRAMUSCULAR | Status: AC
  Filled 2018-08-21: qty 2

## 2018-08-21 MED ORDER — FENTANYL CITRATE (PF) 250 MCG/5ML IJ SOLN
INTRAMUSCULAR | Status: AC
  Filled 2018-08-21: qty 5

## 2018-08-21 MED ORDER — ACETAMINOPHEN 500 MG PO TABS
1000.0000 mg | ORAL_TABLET | ORAL | Status: AC
  Administered 2018-08-21: 1000 mg via ORAL
  Filled 2018-08-21: qty 2

## 2018-08-21 MED ORDER — GLYCOPYRROLATE PF 0.2 MG/ML IJ SOSY
PREFILLED_SYRINGE | INTRAMUSCULAR | Status: AC
  Filled 2018-08-21: qty 1

## 2018-08-21 MED ORDER — PROPOFOL 10 MG/ML IV BOLUS
INTRAVENOUS | Status: DC | PRN
  Administered 2018-08-21: 180 mg via INTRAVENOUS

## 2018-08-21 MED ORDER — MORPHINE SULFATE (PF) 2 MG/ML IV SOLN: 1.0000 mg | INTRAVENOUS | Status: DC | PRN

## 2018-08-21 MED ORDER — SUGAMMADEX SODIUM 200 MG/2ML IV SOLN
INTRAVENOUS | Status: AC
  Filled 2018-08-21: qty 2

## 2018-08-21 MED ORDER — ROCURONIUM BROMIDE 10 MG/ML (PF) SYRINGE
PREFILLED_SYRINGE | INTRAVENOUS | Status: DC | PRN
  Administered 2018-08-21 (×4): 10 mg via INTRAVENOUS
  Administered 2018-08-21: 50 mg via INTRAVENOUS

## 2018-08-21 MED ORDER — FENTANYL CITRATE (PF) 100 MCG/2ML IJ SOLN: 25.0000 ug | INTRAMUSCULAR | Status: DC | PRN

## 2018-08-21 MED ORDER — APREPITANT 40 MG PO CAPS
40.0000 mg | ORAL_CAPSULE | ORAL | Status: AC
  Administered 2018-08-21: 40 mg via ORAL
  Filled 2018-08-21: qty 1

## 2018-08-21 MED ORDER — MIDAZOLAM HCL 5 MG/5ML IJ SOLN
INTRAMUSCULAR | Status: DC | PRN
  Administered 2018-08-21: 2 mg via INTRAVENOUS

## 2018-08-21 MED ORDER — SCOPOLAMINE 1 MG/3DAYS TD PT72
1.0000 | MEDICATED_PATCH | TRANSDERMAL | Status: DC
  Administered 2018-08-21: 06:00:00 1.5 mg via TRANSDERMAL

## 2018-08-21 MED ORDER — PHENYLEPHRINE 40 MCG/ML (10ML) SYRINGE FOR IV PUSH (FOR BLOOD PRESSURE SUPPORT)
PREFILLED_SYRINGE | INTRAVENOUS | Status: AC
  Filled 2018-08-21: qty 20

## 2018-08-21 MED ORDER — EPHEDRINE SULFATE-NACL 50-0.9 MG/10ML-% IV SOSY
PREFILLED_SYRINGE | INTRAVENOUS | Status: DC | PRN
  Administered 2018-08-21: 10 mg via INTRAVENOUS

## 2018-08-21 MED ORDER — BUPIVACAINE LIPOSOME 1.3 % IJ SUSP
INTRAMUSCULAR | Status: DC | PRN
  Administered 2018-08-21: 20 mL

## 2018-08-21 MED ORDER — ENOXAPARIN SODIUM 30 MG/0.3ML ~~LOC~~ SOLN
30.0000 mg | Freq: Two times a day (BID) | SUBCUTANEOUS | Status: DC
  Administered 2018-08-21 – 2018-08-22 (×2): 30 mg via SUBCUTANEOUS
  Filled 2018-08-21 (×2): qty 0.3

## 2018-08-21 MED ORDER — DEXAMETHASONE SODIUM PHOSPHATE 4 MG/ML IJ SOLN: 4.0000 mg | INTRAMUSCULAR | Status: DC

## 2018-08-21 MED ORDER — FENTANYL CITRATE (PF) 100 MCG/2ML IJ SOLN
INTRAMUSCULAR | Status: DC | PRN
  Administered 2018-08-21 (×2): 100 ug via INTRAVENOUS
  Administered 2018-08-21: 50 ug via INTRAVENOUS

## 2018-08-21 MED ORDER — SUCCINYLCHOLINE CHLORIDE 20 MG/ML IJ SOLN
INTRAMUSCULAR | Status: DC | PRN
  Administered 2018-08-21: 120 mg via INTRAVENOUS

## 2018-08-21 MED ORDER — OXYCODONE HCL 5 MG/5ML PO SOLN
5.0000 mg | ORAL | Status: DC | PRN
  Administered 2018-08-21: 10 mg via ORAL
  Filled 2018-08-21: qty 10

## 2018-08-21 MED ORDER — KETOROLAC TROMETHAMINE 15 MG/ML IJ SOLN
15.0000 mg | Freq: Three times a day (TID) | INTRAMUSCULAR | Status: DC | PRN
  Administered 2018-08-21: 15 mg via INTRAVENOUS
  Filled 2018-08-21: qty 1

## 2018-08-21 MED ORDER — KETAMINE HCL 10 MG/ML IJ SOLN
INTRAMUSCULAR | Status: AC
  Filled 2018-08-21: qty 1

## 2018-08-21 MED ORDER — 0.9 % SODIUM CHLORIDE (POUR BTL) OPTIME
TOPICAL | Status: DC | PRN
  Administered 2018-08-21: 1000 mL

## 2018-08-21 MED ORDER — SIMETHICONE 80 MG PO CHEW: 80.0000 mg | CHEWABLE_TABLET | Freq: Four times a day (QID) | ORAL | Status: DC | PRN

## 2018-08-21 MED ORDER — KETAMINE HCL 10 MG/ML IJ SOLN
INTRAMUSCULAR | Status: DC | PRN
  Administered 2018-08-21: 30 mg via INTRAVENOUS

## 2018-08-21 MED ORDER — PHENYLEPHRINE 40 MCG/ML (10ML) SYRINGE FOR IV PUSH (FOR BLOOD PRESSURE SUPPORT)
PREFILLED_SYRINGE | INTRAVENOUS | Status: DC | PRN
  Administered 2018-08-21: 80 ug via INTRAVENOUS

## 2018-08-21 MED ORDER — SODIUM CHLORIDE (PF) 0.9 % IJ SOLN
INTRAMUSCULAR | Status: DC | PRN
  Administered 2018-08-21: 50 mL

## 2018-08-21 MED ORDER — EPHEDRINE 5 MG/ML INJ
INTRAVENOUS | Status: AC
  Filled 2018-08-21: qty 10

## 2018-08-21 MED ORDER — POTASSIUM CHLORIDE IN NACL 20-0.45 MEQ/L-% IV SOLN
INTRAVENOUS | Status: DC
  Administered 2018-08-21 – 2018-08-22 (×3): via INTRAVENOUS
  Filled 2018-08-21 (×4): qty 1000

## 2018-08-21 MED ORDER — PROMETHAZINE HCL 25 MG/ML IJ SOLN
INTRAMUSCULAR | Status: AC
  Filled 2018-08-21: qty 1

## 2018-08-21 MED ORDER — STERILE WATER FOR IRRIGATION IR SOLN
Status: DC | PRN
  Administered 2018-08-21: 1000 mL

## 2018-08-21 MED ORDER — PANTOPRAZOLE SODIUM 40 MG IV SOLR
40.0000 mg | Freq: Every day | INTRAVENOUS | Status: DC
  Administered 2018-08-21: 40 mg via INTRAVENOUS
  Filled 2018-08-21: qty 40

## 2018-08-21 MED ORDER — SODIUM CHLORIDE 0.9 % IV SOLN
2.0000 g | INTRAVENOUS | Status: AC
  Administered 2018-08-21: 2 g via INTRAVENOUS
  Filled 2018-08-21: qty 2

## 2018-08-21 MED ORDER — ONDANSETRON HCL 4 MG/2ML IJ SOLN
INTRAMUSCULAR | Status: AC
  Filled 2018-08-21: qty 2

## 2018-08-21 MED ORDER — HEPARIN SODIUM (PORCINE) 5000 UNIT/ML IJ SOLN
5000.0000 [IU] | INTRAMUSCULAR | Status: AC
  Administered 2018-08-21: 5000 [IU] via SUBCUTANEOUS
  Filled 2018-08-21: qty 1

## 2018-08-21 MED ORDER — SUCCINYLCHOLINE CHLORIDE 200 MG/10ML IV SOSY
PREFILLED_SYRINGE | INTRAVENOUS | Status: AC
  Filled 2018-08-21: qty 20

## 2018-08-21 MED ORDER — LIDOCAINE 2% (20 MG/ML) 5 ML SYRINGE
INTRAMUSCULAR | Status: DC | PRN
  Administered 2018-08-21: 3.9 mL/h via INTRAVENOUS

## 2018-08-21 MED ORDER — GABAPENTIN 300 MG PO CAPS
300.0000 mg | ORAL_CAPSULE | ORAL | Status: AC
  Administered 2018-08-21: 300 mg via ORAL
  Filled 2018-08-21: qty 1

## 2018-08-21 MED ORDER — EVICEL 5 ML EX KIT
PACK | Freq: Once | CUTANEOUS | Status: AC
  Administered 2018-08-21: 1
  Filled 2018-08-21: qty 1

## 2018-08-21 MED ORDER — SODIUM CHLORIDE (PF) 0.9 % IJ SOLN
INTRAMUSCULAR | Status: AC
  Filled 2018-08-21: qty 50

## 2018-08-21 MED ORDER — ONDANSETRON HCL 4 MG/2ML IJ SOLN
INTRAMUSCULAR | Status: AC
  Filled 2018-08-21: qty 4

## 2018-08-21 MED ORDER — INSULIN ASPART 100 UNIT/ML ~~LOC~~ SOLN
0.0000 [IU] | SUBCUTANEOUS | Status: DC
  Administered 2018-08-21 (×2): 4 [IU] via SUBCUTANEOUS
  Administered 2018-08-21: 3 [IU] via SUBCUTANEOUS
  Administered 2018-08-22: 4 [IU] via SUBCUTANEOUS

## 2018-08-21 MED ORDER — DIPHENHYDRAMINE HCL 50 MG/ML IJ SOLN: 12.5000 mg | Freq: Three times a day (TID) | INTRAMUSCULAR | Status: DC | PRN

## 2018-08-21 MED ORDER — LACTATED RINGERS IR SOLN
Status: DC | PRN
  Administered 2018-08-21: 1000 mL

## 2018-08-21 MED ORDER — MIDAZOLAM HCL 2 MG/2ML IJ SOLN
INTRAMUSCULAR | Status: AC
  Filled 2018-08-21: qty 2

## 2018-08-21 MED ORDER — SUGAMMADEX SODIUM 200 MG/2ML IV SOLN
INTRAVENOUS | Status: DC | PRN
  Administered 2018-08-21: 200 mg via INTRAVENOUS

## 2018-08-21 SURGICAL SUPPLY — 86 items
APPLICATOR COTTON TIP 6 STRL (MISCELLANEOUS) IMPLANT
APPLICATOR COTTON TIP 6IN STRL (MISCELLANEOUS) ×3
APPLIER CLIP ROT 13.4 12 LRG (CLIP) ×3
BENZOIN TINCTURE PRP APPL 2/3 (GAUZE/BANDAGES/DRESSINGS) ×3 IMPLANT
BLADE SURG SZ11 CARB STEEL (BLADE) ×3 IMPLANT
BNDG ADH 1X3 SHEER STRL LF (GAUZE/BANDAGES/DRESSINGS) ×22 IMPLANT
CABLE HIGH FREQUENCY MONO STRZ (ELECTRODE) IMPLANT
CHLORAPREP W/TINT 26 (MISCELLANEOUS) ×6 IMPLANT
CLIP APPLIE ROT 13.4 12 LRG (CLIP) IMPLANT
CLIP SUT LAPRA TY ABSORB (SUTURE) ×8 IMPLANT
CLOSURE WOUND 1/2 X4 (GAUZE/BANDAGES/DRESSINGS) ×2
COVER WAND RF STERILE (DRAPES) IMPLANT
CUTTER FLEX LINEAR 45M (STAPLE) IMPLANT
DEVICE SUT QUICK LOAD TK 5 (STAPLE) IMPLANT
DEVICE SUT TI-KNOT TK 5X26 (MISCELLANEOUS) IMPLANT
DEVICE SUTURE ENDOST 10MM (ENDOMECHANICALS) ×3 IMPLANT
DEVICE TI KNOT TK5 (MISCELLANEOUS)
DRAIN PENROSE 18X1/4 LTX STRL (WOUND CARE) ×3 IMPLANT
ELECT REM PT RETURN 15FT ADLT (MISCELLANEOUS) ×3 IMPLANT
GAUZE 4X4 16PLY RFD (DISPOSABLE) ×3 IMPLANT
GAUZE SPONGE 4X4 12PLY STRL (GAUZE/BANDAGES/DRESSINGS) IMPLANT
GLOVE BIO SURGEON STRL SZ7.5 (GLOVE) IMPLANT
GLOVE BIOGEL M 8.0 STRL (GLOVE) ×2 IMPLANT
GLOVE BIOGEL PI IND STRL 6.5 (GLOVE) IMPLANT
GLOVE BIOGEL PI IND STRL 7.0 (GLOVE) IMPLANT
GLOVE BIOGEL PI INDICATOR 6.5 (GLOVE) ×2
GLOVE BIOGEL PI INDICATOR 7.0 (GLOVE) ×6
GLOVE INDICATOR 8.0 STRL GRN (GLOVE) ×3 IMPLANT
GLOVE SURG SS PI 7.0 STRL IVOR (GLOVE) ×4 IMPLANT
GOWN STRL REUS W/ TWL LRG LVL3 (GOWN DISPOSABLE) IMPLANT
GOWN STRL REUS W/TWL LRG LVL3 (GOWN DISPOSABLE) ×2
GOWN STRL REUS W/TWL XL LVL3 (GOWN DISPOSABLE) ×10 IMPLANT
HOVERMATT SINGLE USE (MISCELLANEOUS) ×3 IMPLANT
KIT BASIN OR (CUSTOM PROCEDURE TRAY) ×3 IMPLANT
KIT GASTRIC LAVAGE 34FR ADT (SET/KITS/TRAYS/PACK) ×3 IMPLANT
KIT TURNOVER KIT A (KITS) IMPLANT
MARKER SKIN DUAL TIP RULER LAB (MISCELLANEOUS) ×3 IMPLANT
NDL SPNL 22GX3.5 QUINCKE BK (NEEDLE) ×1 IMPLANT
NEEDLE SPNL 22GX3.5 QUINCKE BK (NEEDLE) ×3 IMPLANT
PACK CARDIOVASCULAR III (CUSTOM PROCEDURE TRAY) ×3 IMPLANT
PENCIL SMOKE EVACUATOR (MISCELLANEOUS) IMPLANT
QUICK LOAD TK 5 (STAPLE)
RELOAD 45 VASCULAR/THIN (ENDOMECHANICALS) IMPLANT
RELOAD ENDO STITCH 2.0 (ENDOMECHANICALS) ×20
RELOAD STAPLE 45 2.5 WHT GRN (ENDOMECHANICALS) IMPLANT
RELOAD STAPLE 45 3.5 BLU ETS (ENDOMECHANICALS) IMPLANT
RELOAD STAPLE 60 2.6 WHT THN (STAPLE) ×2 IMPLANT
RELOAD STAPLE 60 3.6 BLU REG (STAPLE) ×2 IMPLANT
RELOAD STAPLE 60 3.8 GOLD REG (STAPLE) ×1 IMPLANT
RELOAD STAPLE TA45 3.5 REG BLU (ENDOMECHANICALS) IMPLANT
RELOAD STAPLER BLUE 60MM (STAPLE) ×5 IMPLANT
RELOAD STAPLER GOLD 60MM (STAPLE) ×1 IMPLANT
RELOAD STAPLER WHITE 60MM (STAPLE) ×2 IMPLANT
RELOAD SUT SNGL STCH ABSRB 2-0 (ENDOMECHANICALS) ×4 IMPLANT
RELOAD SUT SNGL STCH BLK 2-0 (ENDOMECHANICALS) ×4 IMPLANT
SCISSORS LAP 5X45 EPIX DISP (ENDOMECHANICALS) ×3 IMPLANT
SET IRRIG TUBING LAPAROSCOPIC (IRRIGATION / IRRIGATOR) ×3 IMPLANT
SET TUBE SMOKE EVAC HIGH FLOW (TUBING) ×3 IMPLANT
SHEARS HARMONIC ACE PLUS 45CM (MISCELLANEOUS) ×3 IMPLANT
SLEEVE XCEL OPT CAN 5 100 (ENDOMECHANICALS) ×7 IMPLANT
SOLUTION ANTI FOG 6CC (MISCELLANEOUS) ×3 IMPLANT
STAPLER ECHELON BIOABSB 60 FLE (MISCELLANEOUS) IMPLANT
STAPLER ECHELON LONG 60 440 (INSTRUMENTS) ×3 IMPLANT
STAPLER RELOAD BLUE 60MM (STAPLE) ×15
STAPLER RELOAD GOLD 60MM (STAPLE) ×3
STAPLER RELOAD WHITE 60MM (STAPLE) ×6
STRIP CLOSURE SKIN 1/2X4 (GAUZE/BANDAGES/DRESSINGS) ×3 IMPLANT
SUT MNCRL AB 4-0 PS2 18 (SUTURE) ×5 IMPLANT
SUT RELOAD ENDO STITCH 2 48X1 (ENDOMECHANICALS) ×6
SUT RELOAD ENDO STITCH 2.0 (ENDOMECHANICALS) ×4
SUT SURGIDAC NAB ES-9 0 48 120 (SUTURE) IMPLANT
SUT VIC AB 2-0 SH 27 (SUTURE) ×2
SUT VIC AB 2-0 SH 27X BRD (SUTURE) ×1 IMPLANT
SUTURE RELOAD END STTCH 2 48X1 (ENDOMECHANICALS) ×6 IMPLANT
SUTURE RELOAD ENDO STITCH 2.0 (ENDOMECHANICALS) ×4 IMPLANT
SYR 10ML ECCENTRIC (SYRINGE) ×3 IMPLANT
SYR 20CC LL (SYRINGE) ×6 IMPLANT
TIP RIGID 35CM EVICEL (HEMOSTASIS) ×3 IMPLANT
TOWEL OR 17X26 10 PK STRL BLUE (TOWEL DISPOSABLE) ×3 IMPLANT
TOWEL OR NON WOVEN STRL DISP B (DISPOSABLE) ×3 IMPLANT
TRAY FOLEY MTR SLVR 14FR STAT (SET/KITS/TRAYS/PACK) ×2 IMPLANT
TROCAR BLADELESS OPT 5 100 (ENDOMECHANICALS) ×3 IMPLANT
TROCAR UNIVERSAL OPT 12M 100M (ENDOMECHANICALS) ×9 IMPLANT
TROCAR XCEL 12X100 BLDLESS (ENDOMECHANICALS) ×3 IMPLANT
TUBING CONNECTING 10 (TUBING) ×4 IMPLANT
TUBING CONNECTING 10' (TUBING) ×2

## 2018-08-21 NOTE — Progress Notes (Signed)
PHARMACY CONSULT FOR:  Risk Assessment for Post-Discharge VTE Following Bariatric Surgery  Post-Discharge VTE Risk Assessment: This patient's probability of 30-day post-discharge VTE is increased due to the factors marked:   Female    Age >/=60 years    BMI >/=50 kg/m2    CHF    Dyspnea at Rest    Paraplegia  X  Non-gastric-band surgery  X Operation Time >/=3 hr    Return to OR     Length of Stay >/= 3 d   Predicted probability of 30-day post-discharge VTE: 0.25 %  Other patient-specific factors to consider: none  Recommendation for Discharge: No pharmacologic prophylaxis post-discharge  Jadine Brumley is a 58 y.o. 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  Recent Labs    08/21/18 1206  HGB 14.3  HCT 46.4*   Estimated Creatinine Clearance: 89.5 mL/min (by C-G formula based on SCr of 0.48 mg/dL).    Past Medical History:  Diagnosis Date  . Diabetes mellitus without complication ([place])    type II  . GERD (gastroesophageal reflux disease)   . Hyperlipidemia   . Insomnia   . Restless leg      Medications Prior to Admission  Medication Sig Dispense Refill Last Dose  . dapagliflozin propanediol (FARXIGA) 10 MG TABS tablet Take 10 mg by mouth daily. 30 tablet 2 Past Week at Unknown time  . Insulin Detemir (LEVEMIR FLEXTOUCH) 100 UNIT/ML Pen Inject 15 Units into the skin 2 (two) times daily. 30 pen 0 08/21/2018 at Unknown time  . rOPINIRole (REQUIP) 2 MG tablet TAKE 1/2 (ONE-HALF) TABLET BY MOUTH AT BEDTIME FOR 7 DAYS, THEN TAKE 1 AT BEDTIME (Patient taking differently: Take 2 mg by mouth at bedtime. ) 90 tablet 0 08/20/2018 at Unknown time  . Semaglutide, 1 MG/DOSE, (OZEMPIC, 1 MG/DOSE,) 2 MG/1.5ML SOPN Inject 1 mg into the skin once a week.  (Patient taking differently: Inject 1 mg into the skin every Monday. ) 6 pen 3 08/19/2018  . atorvastatin (LIPITOR) 40 MG tablet Take 1 tablet (40 mg total) by mouth daily. 90 tablet 3 More than a month at Unknown time  . diphenhydrAMINE (BENADRYL) 50 MG tablet Take 100 mg by mouth at bedtime.   Unknown at Unknown time  . glucose blood (ONETOUCH VERIO) test strip Test three times daily to check blood sugar.  DXE11.9 200 each 6   . ONE TOUCH LANCETS MISC Test as directed three times daily to check blood sugar.  DXE11.9 200 each [place], Pharm.D [phone] 08/21/2018 1:50 PM

## 2018-08-21 NOTE — Transfer of Care (Signed)
Immediate Anesthesia Transfer of Care Note  Patient: Lisa [name]  Procedure(s) Performed: LAPAROSCOPIC ROUX-EN-Y GASTRIC BYPASS WITH UPPER ENDOSCOPY, LYSIS OF ADHESIONS X1HOUR, ERAS PATHWAY (N/A Abdomen)  Patient Location: PACU  Anesthesia Type:General  Level of Consciousness: sedated, patient cooperative and responds to stimulation  Airway & Oxygen Therapy: Patient Spontanous Breathing and Patient connected to face mask oxygen  Post-op Assessment: Report given to RN and Post -op Vital signs reviewed and stable  Post vital signs: Reviewed and stable  Last Vitals:  Vitals Value Taken Time  BP 144/73 08/21/18 1116  Temp    Pulse 72 08/21/18 1118  Resp 11 08/21/18 1118  SpO2 100 % 08/21/18 1118  Vitals shown include unvalidated device data.  Last Pain:  Vitals:   08/21/18 0611  TempSrc: Oral      Patients Stated Pain Goal: 4 (50/03/70 4888)  Complications: No apparent anesthesia complications

## 2018-08-21 NOTE — Progress Notes (Signed)
Discussed post op day goals with patient including ambulation, IS, diet progression, pain, and nausea control.  BSTOP education provided including BSTOP flyer.  Questions answered. 

## 2018-08-21 NOTE — Progress Notes (Signed)
Pt started drinking first 2oz cup of water at 1700.

## 2018-08-21 NOTE — Anesthesia Procedure Notes (Signed)
Procedure Name: Intubation Performed by: Lanijah Warzecha J, CRNA Pre-anesthesia Checklist: Patient identified, Emergency Drugs available, Suction available, Patient being monitored and Timeout performed Patient Re-evaluated:Patient Re-evaluated prior to induction Oxygen Delivery Method: Circle system utilized Preoxygenation: Pre-oxygenation with 100% oxygen Induction Type: IV induction Ventilation: Mask ventilation without difficulty Laryngoscope Size: Mac and 4 Grade View: Grade I Tube type: Oral Tube size: 7.0 mm Number of attempts: 1 Airway Equipment and Method: Stylet Placement Confirmation: ETT inserted through vocal cords under direct vision,  positive ETCO2,  CO2 detector and breath sounds checked- equal and bilateral Secured at: 21 cm Tube secured with: Tape Dental Injury: Teeth and Oropharynx as per pre-operative assessment        

## 2018-08-21 NOTE — Op Note (Signed)
Lisa [name] [id number] Dec 15, 1960. 08/21/2018  Preoperative diagnosis:    Severe obesity (BMI 40)   Diabetes mellitus type 2 in obese (HCC)   Hyperlipidemia   Fatty liver  Postoperative  diagnosis:  1. Same + intra- abdominal adhesions  Surgical procedure: Laparoscopic Roux-en-Y gastric bypass (ante-colic, ante-gastric); upper endoscopy; laparoscopic lysis of adhesions x 1hr  Surgeon: Atilano InaEric M Sameka Bagent, M.D. FACS  Asst.: Luretha MurphyMatthew Martin MD FACS  Anesthesia: General plus exparel/marcaine mix  Complications: None   EBL: 25 cc  Drains: None   Disposition: PACU in good condition   Indications for procedure: 58 y.o. yo female with morbid obesity who has been unsuccessful at sustained weight loss. The patient's comorbidities are listed above. We discussed the risk and benefits of surgery including but not limited to anesthesia risk, bleeding, infection, blood clot formation, anastomotic leak, anastomotic stricture, ulcer formation, death, respiratory complications, intestinal blockage, internal hernia, gallstone formation, vitamin and nutritional deficiencies, injury to surrounding structures, failure to lose weight and mood changes.   Description of procedure: Patient is brought to the operating room and general anesthesia induced. The patient had received preoperative broad-spectrum IV antibiotics and subcutaneous heparin. The abdomen was widely sterilely prepped with Chloraprep and draped. Patient timeout was performed and correct patient and procedure confirmed. Access was obtained with a 12 mm Optiview trocar in the left upper quadrant and pneumoperitoneum established without difficulty.  Upon surveying the abdominal cavity there was omental adhesions in the midline extending down into the lower midline as well as left and right abdominal walls.  I placed a 12 mm trocar slightly above into the left of the umbilicus.  Then using harmonic scalpel along with EndoShears with and without  electrocautery I started taking down the omental adhesions from the anterior abdominal wall.  As I approached the left lower pelvis it became obvious that some of the distal small bowel appeared tethered to the right lower quadrant abdominal wall.  I ended up placing 2 additional trochars 1 in the right lateral abdominal wall at the 5 mm trocar in the right in the mid hypochondrium as a 5 mm trocar.  The omentum was tethered along the left paracolic gutter is mobilized to the sigmoid colon.  It was also tethered into the lower midline.  I was able to identify a natural defect within the omentum and look underneath it.  There appeared to be a window where I could open up the omentum up the midline.  I ended up placing 2 additional 5 mm trochars one in the right lower quadrant and one in the left lower quadrant to aid with  visualization.  The omentum was tethered along the sigmoid colon.  However there was a defect in the central portion of lower omentum so therefore I decided to open up the omentum along its midportion.  This was done with harmonic scalpel all the way up to the level of the transverse colon.  At this point I decided to run the small bowel starting at the ligament of Treitz to make sure that there were no significant intra-abdominal small bowel adhesions that would prevent her from continuing to work toward doing a Roux-en-Y gastric bypass.  Around the bowel approximately 200 cm from the ligament of Treitz and there is no intraloop small bowel adhesions.  The small bowel that I had seen tethered in the right lower quadrant was the terminal ileum.  At this point with the omentum still attached to the sigmoid colon left it was  still a little bit challenging to raise the transverse colon upwards so I detached the omentum from the medial portion of the sigmoid colon and descending colon.  There was no devascularization of the mesentery of the descending and sigmoid colon.  At this point I changed my two  5 mm trochars in the right abdomen to 12 mm trochars.  We spent approximately 1 hour lysing adhesions in order to proceed with the procedure.  Exparel/marcaine mix was infiltrated in bilateral lateral abdominal walls as a TAP block.  The omentum was brought into the upper abdomen and the transverse mesocolon elevated and the ligament of Treitz clearly identified. A 40 cm biliopancreatic limb was then carefully measured from the ligament of Treitz. The small intestine was divided at this point with a single firing of the white load linear stapler. A Penrose drain was sutured to the end of the Roux-en-Y limb for later identification. A 100 cm Roux-en-Y limb was then carefully measured. At this point a side-to-side anastomosis was created between the Roux limb and the end of the biliopancreatic limb. This was accomplished with a single firing of the 60 mm white load linear stapler. The common enterotomy was closed with a running 2-0 Vicryl begun at either end of the enterotomy and tied centrally. Eviceal tissue sealant was placed over the anastomosis. The mesenteric defect was then closed with running 2-0 silk. The omentum was then divided with the harmonic scalpel up towards the transverse colon to allow mobility of the Roux limb toward the gastric pouch. The patient was then placed in steep reversed Trendelenburg. Through a 5 mm subxiphoid site the [facility] retractor was placed and the left lobe of the liver elevated with excellent exposure of the upper stomach and hiatus. The angle of Hiss was then mobilized with the harmonic scalpel. A 4 cm gastric pouch was then carefully measured along the lesser curve of the stomach. Dissection was carried along the lesser curve at this point with the Harmonic scalpel working carefully back toward the lesser sac at right angles to the lesser curve. The free lesser sac was then entered. After being sure all tubes were removed from the stomach an initial firing of the gold load  60 mm linear stapler was fired at right angles across the lesser curve for about 4 cm. The gastric pouch was further mobilized posteriorly and then the pouch was completed with 3 further firings of the 60 mm blue load linear stapler up through the previously dissected angle of His. It was ensured that the pouch was completely mobilized away from the gastric remnant. This created a nice tubular 4-5 cm gastric pouch.  There was a little bit of bleeding along the gastric pouch at the very end of the staple line.  This was taken care of with 2 hemoclips.  The Roux limb was then brought up in an antecolic fashion with the candycane facing to the patient's left without undue tension. The gastrojejunostomy was created with an initial posterior row of 2-0 Vicryl between the Roux limb and the staple line of the gastric pouch. Enterotomies were then made in the gastric pouch and the Roux limb with the harmonic scalpel and at approximately 2-2-1/2 cm anastomosis was created with a single firing of the 55mm blue load linear stapler. The staple line was inspected and was intact without bleeding. The common enterotomy was then closed with running 2-0 Vicryl begun at either end and tied centrally. The Ewall tube was then easily passed through  the anastomosis and an outer anterior layer of running 2-0 Vicryl was placed. The Ewald tube was removed. With the outlet of the gastrojejunostomy clamped and under saline irrigation the assistant performed upper endoscopy and with the gastric pouch tensely distended with air-there was no evidence of leak on this test. The pouch was desufflated. The Vonita Mosseterson defect was closed with running 2-0 silk. The abdomen was inspected for any evidence of bleeding or bowel injury and everything looked fine. The Nathanson retractor was removed under direct vision after coating the anastomosis with Eviceal tissue sealant. All CO2 was evacuated and trochars removed. Skin incisions were closed with 4-0  monocryl in a subcuticular fashion followed by benzoin, steri-strips and bandages. Sponge needle and instrument counts were correct. The patient was taken to the PACU in good condition.    Mary SellaEric M. Andrey CampanileWilson, MD, FACS General, Bariatric, & Minimally Invasive Surgery [facility], [place]

## 2018-08-21 NOTE — Progress Notes (Signed)
Inpatient Diabetes Program Recommendations  AACE/ADA: New Consensus Statement on Inpatient Glycemic Control (2015)  Target Ranges:  Prepandial:   less than 140 mg/dL      Peak postprandial:   less than 180 mg/dL (1-2 hours)      Critically ill patients:  140 - 180 mg/dL   Lab Results  Component Value Date   GLUCAP 158 (H) 08/21/2018   HGBA1C 6.9 (H) 08/17/2018    Review of Glycemic Control  Diabetes history: DM2 Outpatient Diabetes medications: Levemir 15 units bid, Ozempic 1 mg weekly, Farxiga 10 mg QD Current orders for Inpatient glycemic control: Novolog 0-20 units Q4H  HgbA1C - 6.9% - excellent glycemic control! Blood sugars 106, 158 mg/dL thus far today. Received Decadron 8 mg at 1045.  Inpatient Diabetes Program Recommendations:     Agree with orders.  For home - will likely only need a rapid-acting insulin (Novolog/Humalog).  Will need to monitor blood sugars at least 4x/day. Call endo if blood sugars > 200 on a regular basis. F/U with endo regarding glycemic control.  Thank you. Lorenda Peck, RD, LDN, CDE Inpatient Diabetes Coordinator [phone]

## 2018-08-21 NOTE — Anesthesia Postprocedure Evaluation (Signed)
Anesthesia Post Note  Patient: Lisa [name]  Procedure(s) Performed: LAPAROSCOPIC ROUX-EN-Y GASTRIC BYPASS WITH UPPER ENDOSCOPY, LYSIS OF ADHESIONS X1HOUR, ERAS PATHWAY (N/A Abdomen)     Patient location during evaluation: PACU Anesthesia Type: General Level of consciousness: sedated Pain management: pain level controlled Vital Signs Assessment: post-procedure vital signs reviewed and stable Respiratory status: spontaneous breathing and respiratory function stable Cardiovascular status: stable Postop Assessment: no apparent nausea or vomiting Anesthetic complications: no    Last Vitals:  Vitals:   08/21/18 1542 08/21/18 1615  BP: 139/75 (!) 147/89  Pulse: 85 83  Resp: 18 20  Temp: 36.7 C 36.8 C  SpO2: 97% 100%    Last Pain:  Vitals:   08/21/18 1645  TempSrc:   PainSc: 3                  [name] DANIEL

## 2018-08-21 NOTE — Op Note (Signed)
Lisa [name] [id number] 01/26/1961 08/21/2018  Preoperative diagnosis: roux en Y gastric bypass en progress  Postoperative diagnosis: Same   Procedure: Upper endoscopy   Surgeon: Catalina Antigua B. Hassell Done  M.D., FACS   Anesthesia: Gen.   Indications for procedure: This patient was undergoing an enterolysis and Roux en Y gastric bypass.  Endoscopy to assess for bleeding or leaks (bubbles).    Description of procedure: The endoscopy was placed in the mouth and into the oropharynx and under endoscopic vision it was advanced to the esophagogastric junction.  The pouch was insufflated and it appeared to be about 5 cm in length.  No bubbles were seen with insufflation.  No hiatal hernia was seen.  .   No bleeding or leaks were detected.  The scope was withdrawn without difficulty.     Matt B. Hassell Done, MD, FACS General, Bariatric, & Minimally Invasive Surgery [facility], [place]

## 2018-08-21 NOTE — Interval H&P Note (Signed)
History and Physical Interval Note:  08/21/2018 7:08 AM  Lisa [name]  has presented today for surgery, with the diagnosis of Morbid Obesity, Elevated LDL Cholesterol Level, Insulain Dependent DM.  The various methods of treatment have been discussed with the patient and family. After consideration of risks, benefits and other options for treatment, the patient has consented to  Procedure(s): LAPAROSCOPIC ROUX-EN-Y GASTRIC BYPASS WITH UPPER ENDOSCOPY, ERAS PATHWAY (N/A) as a surgical intervention.  The patient's history has been reviewed, patient examined, no change in status, stable for surgery.  I have reviewed the patient's chart and labs.  Questions were answered to the patient's satisfaction.    Leighton Ruff. Redmond Pulling, MD, FACS General, Bariatric, & Minimally Invasive Surgery [facility], [place]   [name]

## 2018-08-22 ENCOUNTER — Encounter (HOSPITAL_COMMUNITY): Payer: Self-pay | Admitting: General Surgery

## 2018-08-22 LAB — CBC WITH DIFFERENTIAL/PLATELET
Abs Immature Granulocytes: 0.09 10*3/uL — ABNORMAL HIGH (ref 0.00–0.07)
Basophils Absolute: 0 10*3/uL (ref 0.0–0.1)
Basophils Relative: 0 %
Eosinophils Absolute: 0 10*3/uL (ref 0.0–0.5)
Eosinophils Relative: 0 %
HCT: 43.2 % (ref 36.0–46.0)
Hemoglobin: 13.6 g/dL (ref 12.0–15.0)
Immature Granulocytes: 1 %
Lymphocytes Relative: 10 %
Lymphs Abs: 1.3 10*3/uL (ref 0.7–4.0)
MCH: 27.2 pg (ref 26.0–34.0)
MCHC: 31.5 g/dL (ref 30.0–36.0)
MCV: 86.4 fL (ref 80.0–100.0)
Monocytes Absolute: 1.2 10*3/uL — ABNORMAL HIGH (ref 0.1–1.0)
Monocytes Relative: 9 %
Neutro Abs: 11.2 10*3/uL — ABNORMAL HIGH (ref 1.7–7.7)
Neutrophils Relative %: 80 %
Platelets: 303 10*3/uL (ref 150–400)
RBC: 5 MIL/uL (ref 3.87–5.11)
RDW: 14.8 % (ref 11.5–15.5)
WBC: 13.9 10*3/uL — ABNORMAL HIGH (ref 4.0–10.5)
nRBC: 0 % (ref 0.0–0.2)

## 2018-08-22 LAB — COMPREHENSIVE METABOLIC PANEL
ALT: 36 U/L (ref 0–44)
AST: 27 U/L (ref 15–41)
Albumin: 3.6 g/dL (ref 3.5–5.0)
Alkaline Phosphatase: 56 U/L (ref 38–126)
Anion gap: 9 (ref 5–15)
BUN: 8 mg/dL (ref 6–20)
CO2: 24 mmol/L (ref 22–32)
Calcium: 8.9 mg/dL (ref 8.9–10.3)
Chloride: 105 mmol/L (ref 98–111)
Creatinine, Ser: 0.48 mg/dL (ref 0.44–1.00)
GFR calc Af Amer: >60 mL/min (ref >60)
GFR calc non Af Amer: >60 mL/min (ref >60)
Glucose, Bld: 114 mg/dL — ABNORMAL HIGH (ref 70–99)
Potassium: 4.3 mmol/L (ref 3.5–5.1)
Sodium: 138 mmol/L (ref 135–145)
Total Bilirubin: 0.7 mg/dL (ref 0.3–1.2)
Total Protein: 6.9 g/dL (ref 6.5–8.1)

## 2018-08-22 LAB — GLUCOSE, CAPILLARY
Glucose-Capillary: 106 mg/dL — ABNORMAL HIGH (ref 70–99)
Glucose-Capillary: 108 mg/dL — ABNORMAL HIGH (ref 70–99)
Glucose-Capillary: 110 mg/dL — ABNORMAL HIGH (ref 70–99)
Glucose-Capillary: 155 mg/dL — ABNORMAL HIGH (ref 70–99)
Glucose-Capillary: 167 mg/dL — ABNORMAL HIGH (ref 70–99)

## 2018-08-22 MED ORDER — INSULIN LISPRO (1 UNIT DIAL) 100 UNIT/ML (KWIKPEN): 0.0000 [IU] | PEN_INJECTOR | Freq: Three times a day (TID) | SUBCUTANEOUS | 11 refills | Status: DC | PRN

## 2018-08-22 MED ORDER — TRAMADOL HCL 50 MG PO TABS: 50.0000 mg | ORAL_TABLET | Freq: Four times a day (QID) | ORAL | 0 refills | Status: DC | PRN

## 2018-08-22 MED ORDER — PANTOPRAZOLE SODIUM 40 MG PO TBEC: 40.0000 mg | DELAYED_RELEASE_TABLET | Freq: Every day | ORAL | 0 refills | Status: DC

## 2018-08-22 MED ORDER — ACETAMINOPHEN 500 MG PO TABS: 1000.0000 mg | ORAL_TABLET | Freq: Three times a day (TID) | ORAL | 0 refills | Status: AC

## 2018-08-22 MED ORDER — ONDANSETRON 4 MG PO TBDP: 4.0000 mg | ORAL_TABLET | Freq: Four times a day (QID) | ORAL | 0 refills | Status: DC | PRN

## 2018-08-22 NOTE — Discharge Instructions (Signed)
For blood sugars: 0 to 120           give 0 units Humalog 121 to 150       Give 3 units Humalog 151 to 200       Give 4 units Humalog 201 to 250       Give 7 units 251 to 300       Give  11 units 301 to 350       Give 15 units 351 to 400       Give 20 units >400                Call Doctor         GASTRIC BYPASS/SLEEVE  Home Care Instructions   These instructions are to help you care for yourself when you go home.  Call: If you have any problems.  Call [phone] and ask for the surgeon on call  If you need immediate help, come to the ER at [facility].   Tell the ER staff that you are a new post-op gastric bypass or gastric sleeve patient   Signs and symptoms to report:  Severe vomiting or nausea o If you cannot keep down clear liquids for longer than 1 day, call your surgeon   Abdominal pain that does not get better after taking your pain medication  Fever over 100.4 F with chills  Heart beating over 100 beats a minute  Shortness of breath at rest  Chest pain   Redness, swelling, drainage, or foul odor at incision (surgical) sites   If your incisions open or pull apart  Swelling or pain in calf (lower leg)  Diarrhea (Loose bowel movements that happen often), frequent watery, uncontrolled bowel movements  Constipation, (no bowel movements for 3 days) if this happens: Pick one o Milk of Magnesia, 2 tablespoons by mouth, 3 times a day for 2 days if needed o Stop taking Milk of Magnesia once you have a bowel movement o Call your doctor if constipation continues Or o Miralax  (instead of Milk of Magnesia) following the label instructions o Stop taking Miralax once you have a bowel movement o Call your doctor if constipation continues  Anything you think is not normal   Normal side effects after surgery:  Unable to sleep at night or unable to focus  Irritability or moody  Being tearful (crying) or depressed These are common complaints, possibly  related to your anesthesia medications that put you to sleep, stress of surgery, and change in lifestyle.  This usually goes away a few weeks after surgery.  If these feelings continue, call your primary care doctor.   Wound Care: You may have surgical glue, steri-strips, or staples over your incisions after surgery  Surgical glue:  Looks like a clear film over your incisions and will wear off a little at a time  Steri-strips: Strips of tape over your incisions. You may notice a yellowish color on the skin under the steri-strips. This is used to make the   steri-strips stick better. Do not pull the steri-strips off - let them fall off  Staples: Staples may be removed before you leave the hospital o If you go home with staples, call [facility], [phone]) [phone] at for an appointment with your surgeons nurse to have staples removed 10 days after surgery.  Showering: You may shower two (2) days after your surgery unless your surgeon tells you differently o Wash gently around incisions with warm soapy water,  rinse well, and gently pat dry  o No tub baths until staples are removed, steri-strips fall off or glue is gone.    Medications:  Medications should be liquid or crushed if larger than the size of a dime  Extended release pills (medication that release a little bit at a time through the day) should NOT be crushed or cut. (examples include XL, ER, DR, SR)  Depending on the size and number of medications you take, you may need to space (take a few throughout the day)/change the time you take your medications so that you do not over-fill your pouch (smaller stomach)  Make sure you follow-up with your primary care doctor to make medication changes needed during rapid weight loss and life-style changes  If you have diabetes, follow up with the doctor that orders your diabetes medication(s) within one week after surgery and check your blood sugar regularly.  Do not drive while taking  prescription pain medication   It is ok to take Tylenol by the bottle instructions with your pain medicine or instead of your pain medicine as needed.  DO NOT TAKE NSAIDS (EXAMPLES OF NSAIDS:  IBUPROFREN/ NAPROXEN)  Diet:                    First 2 Weeks  You will see the dietician t about two (2) weeks after your surgery. The dietician will increase the types of foods you can eat if you are handling liquids well:  If you have severe vomiting or nausea and cannot keep down clear liquids lasting longer than 1 day, call your surgeon @ [phone]) Protein Shake  Drink at least 2 ounces of shake 5-6 times per day  Each serving of protein shakes (usually 8 - 12 ounces) should have: o 15 grams of protein  o And no more than 5 grams of carbohydrate   Goal for protein each day: o Men = 80 grams per day o Women = 60 grams per day  Protein powder may be added to fluids such as non-fat milk or Lactaid milk or unsweetened Soy/Almond milk (limit to 35 grams added protein powder per serving)  Hydration  Slowly increase the amount of water and other clear liquids as tolerated (See Acceptable Fluids)  Slowly increase the amount of protein shake as tolerated    Sip fluids slowly and throughout the day.  Do not use straws.  May use sugar substitutes in small amounts (no more than 6 - 8 packets per day; i.e. Splenda)  Fluid Goal  The first goal is to drink at least 8 ounces of protein shake/drink per day (or as directed by the nutritionist); some examples of protein shakes are [employer], Dillard's, EAS Edge HP, and Unjury. See handout from pre-op Bariatric Education Class: o Slowly increase the amount of protein shake you drink as tolerated o You may find it easier to slowly sip shakes throughout the day o It is important to get your proteins in first  Your fluid goal is to drink 64 - 100 ounces of fluid daily o It may take a few weeks to build up to this  32 oz (or more) should  be clear liquids  And   32 oz (or more) should be full liquids (see below for examples)  Liquids should not contain sugar, caffeine, or carbonation  Clear Liquids:  Water or Sugar-free flavored water (i.e. Fruit H2O, Propel)  Decaffeinated coffee or tea (sugar-free)  Crystal Lite, YUM[employer] Brands, Minute  Maid Lite  Sugar-free Jell-O  Bouillon or broth  Sugar-free Popsicle:   *Less than 20 calories each; Limit 1 per day  Full Liquids: Protein Shakes/Drinks + 2 choices per day of other full liquids  Full liquids must be: o No More Than 15 grams of Carbs per serving  o No More Than 3 grams of Fat per serving  Strained low-fat cream soup (except Cream of Potato or Tomato)  Non-Fat milk  Fat-free Lactaid Milk  Unsweetened Soy Or Unsweetened Almond Milk  Low Sugar yogurt (Dannon Lite & Fit, Greek yogurt; Oikos Triple Zero; Chobani Simply 100; Yoplait 100 calorie [place] - No Fruit on the Bottom)    Vitamins and Minerals  Start 1 day after surgery unless otherwise directed by your surgeon  2 Chewable Bariatric Specific Multivitamin / Multimineral Supplement with iron (Example: Bariatric Advantage Multi EA)  Chewable Calcium with Vitamin D-3 (Example: 3 Chewable Calcium Plus 600 with Vitamin D-3) o Take 500 mg three (3) times a day for a total of 1500 mg each day o Do not take all 3 doses of calcium at one time as it may cause constipation, and you can only absorb 500 mg  at a time  o Do not mix multivitamins containing iron with calcium supplements; take 2 hours apart  Menstruating women and those with a history of anemia (a blood disease that causes weakness) may need extra iron o Talk with your doctor to see if you need more iron  Do not stop taking or change any vitamins or minerals until you talk to your dietitian or surgeon  Your Dietitian and/or surgeon must approve all vitamin and mineral supplements   Activity and Exercise: Limit your physical activity as  instructed by your doctor.  It is important to continue walking at home.  During this time, use these guidelines:  Do not lift anything greater than ten (10) pounds for at least two (2) weeks  Do not go back to work or drive until Designer, industrial/productyour surgeon says you can  You may have sex when you feel comfortable  o It is VERY important for female patients to use a reliable birth control method; fertility often increases after surgery  o All hormonal birth control will be ineffective for 30 days after surgery due to medications given during surgery a barrier method must be used. o Do not get pregnant for at least 18 months  Start exercising as soon as your doctor tells you that you can o Make sure your doctor approves any physical activity  Start with a simple walking program  Walk 5-15 minutes each day, 7 days per week.   Slowly increase until you are walking 30-45 minutes per day Consider joining our BELT program. [phone] or email [email or web address]   Special Instructions Things to remember:  Use your CPAP when sleeping if this applies to you   [facility] has two free Bariatric Surgery Support Groups that meet monthly o The 3rd Thursday of each month, 6 pm, [facility]  o The 2nd Friday of each month, 11:45 am in the private dining room in the basement of [facility]  It is very important to keep all follow up appointments with your surgeon, dietitian, primary care physician, and behavioral health practitioner  Routine follow up schedule with your surgeon include appointments at 2-3 weeks, 6-8 weeks, 6 months, and 1 year at a minimum.  Your surgeon may request to see you more often.   o  After the first year, please follow up with your bariatric surgeon and dietitian at least once a year in order to maintain best weight loss results [facility]: [place]: [phone] Bariatric Nurse  Coordinator: [phone]      Reviewed and Endorsed  by [facility] Patient Education Committee, June, 2016 Edits Approved: Aug, 2018

## 2018-08-22 NOTE — Progress Notes (Signed)
Patient alert and oriented, pain is controlled. Patient is tolerating fluids, advanced to protein shake today, patient is tolerating well. Reviewed Gastric Bypass discharge instructions with patient and patient is able to articulate understanding. Provided information on BELT program, Support Group and [facility] outpatient pharmacy. All questions answered, will continue to monitor.   Total fluid intake 1190 Per dehydration protocol call back one week post op

## 2018-08-22 NOTE — Progress Notes (Signed)
Discharge instructions given to pt and all questions were answered.  

## 2018-08-23 NOTE — Discharge Summary (Signed)
Physician Discharge Summary  Lisa [name] LFY:[id number] DOB: 11-12-1960 DOA: 08/21/2018  PCP: Shelda Pal, DO  Admit date: 08/21/2018 Discharge date: 08/22/2018  Recommendations for Outpatient Follow-up:    Follow-up Information    [name], MD. Go on 09/12/2018.   Specialty: General Surgery Why: at [address] information: [address] [place] [address] [phone]        Carlena Hurl, PA-C. Go on 10/30/2018.   Specialty: General Surgery Why: at [place] information: [place] [address] [phone]          Discharge Diagnoses:  Principal Problem:   Severe obesity (BMI >= 40) (HCC) Active Problems:   Diabetes mellitus type 2 in obese ([facility])   Hyperlipidemia   Fatty liver   Gastric bypass status for obesity   Surgical Procedure: Laparoscopic Roux-en-Y gastric bypass, upper endoscopy  Discharge Condition: Good Disposition: Home  Diet recommendation: Postoperative gastric bypass diet  Filed Weights   08/21/18 0621  Weight: 101.9 kg     Hospital Course:  The patient was admitted for a planned laparoscopic Roux-en-Y gastric bypass. Please see operative note. Preoperatively the patient was given 5000 units of subcutaneous heparin for DVT prophylaxis. ERAS protocol was used. Postoperative prophylactic Lovenox dosing was started on the evening of postoperative day 0.  The patient was started on ice chips and water on the evening of POD 0 which they tolerated. On postoperative day 1 The patient's diet was advanced to protein shakes which they also tolerated. On POD1, The patient was ambulating without difficulty. Their vital signs are stable without fever or tachycardia. Their hemoglobin had remained stable.  The patient had received discharge instructions and counseling. They were deemed stable for discharge.  BP 134/71 (BP Location: Left Arm)   Pulse 65   Temp 98.9 F (37.2 C) (Oral)   Resp 18    Ht 5\' 3"  (1.6 m)   Wt 101.9 kg   SpO2 95%   BMI 39.78 kg/m   Gen: alert, NAD, non-toxic appearing Pupils: equal, no scleral icterus Pulm: Lungs clear to auscultation, symmetric chest rise CV: regular rate and rhythm Abd: soft, min tender, nondistended. No cellulitis. No incisional hernia Ext: no edema, no calf tenderness Skin: no rash, no jaundice  Discharge Instructions  Discharge Instructions    Ambulate hourly while awake   Complete by: As directed    Call MD for:  difficulty breathing, headache or visual disturbances   Complete by: As directed    Call MD for:  persistant dizziness or light-headedness   Complete by: As directed    Call MD for:  persistant nausea and vomiting   Complete by: As directed    Call MD for:  redness, tenderness, or signs of infection (pain, swelling, redness, odor or green/yellow discharge around incision site)   Complete by: As directed    Call MD for:  severe uncontrolled pain   Complete by: As directed    Call MD for:  temperature >101 F   Complete by: As directed    Diet bariatric full liquid   Complete by: As directed    Discharge instructions   Complete by: As directed    See bariatric discharge instructions   Incentive spirometry   Complete by: As directed    Perform hourly while awake     Allergies as of 08/22/2018   No Known Allergies     Medication List    STOP taking these  medications   dapagliflozin propanediol 10 MG Tabs tablet Commonly known as: FARXIGA   Levemir FlexTouch 100 UNIT/ML Pen Generic drug: Insulin Detemir   Semaglutide (1 MG/DOSE) 2 MG/1.5ML Sopn Commonly known as: Ozempic (1 MG/DOSE)     TAKE these medications   acetaminophen 500 MG tablet Commonly known as: TYLENOL Take 2 tablets (1,000 mg total) by mouth every 8 (eight) hours for 5 days.   atorvastatin 40 MG tablet Commonly known as: LIPITOR Take 1 tablet (40 mg total) by mouth daily.   diphenhydrAMINE 50 MG tablet Commonly known as:  BENADRYL Take 100 mg by mouth at bedtime.   glucose blood test strip Commonly known as: OneTouch Verio Test three times daily to check blood sugar.  DXE11.9   insulin lispro 100 UNIT/ML KwikPen Commonly known as: HumaLOG KwikPen Inject 0-0.15 mLs (0-15 Units total) into the skin 3 (three) times daily as needed. See sliding scale instructions   ondansetron 4 MG disintegrating tablet Commonly known as: ZOFRAN-ODT Take 1 tablet (4 mg total) by mouth every 6 (six) hours as needed for nausea or vomiting.   ONE TOUCH LANCETS Misc Test as directed three times daily to check blood sugar.  DXE11.9   pantoprazole 40 MG tablet Commonly known as: PROTONIX Take 1 tablet (40 mg total) by mouth daily.   rOPINIRole 2 MG tablet Commonly known as: REQUIP TAKE 1/2 (ONE-HALF) TABLET BY MOUTH AT BEDTIME FOR 7 DAYS, THEN TAKE 1 AT BEDTIME What changed: See the new instructions.   traMADol 50 MG tablet Commonly known as: ULTRAM Take 1 tablet (50 mg total) by mouth every 6 (six) hours as needed (pain).      Follow-up Information    Gaynelle AduWilson, Shonn Farruggia, MD. Go on 09/12/2018.   Specialty: General Surgery Why: at [address]  Contact information: [address] [place] [address] [phone]        Hedda SladeGosai, Puja, PA-C. Go on 10/30/2018.   Specialty: General Surgery Why: at [address] Contact information: [address] [place] [address] [phone]            The results of significant diagnostics from this hospitalization (including imaging, microbiology, ancillary and laboratory) are listed below for reference.    Significant Diagnostic Studies: No results found.  Labs: Basic Metabolic Panel: Recent Labs  Lab 08/17/18 1108 08/22/18 0410  NA 140 138  K 4.2 4.3  CL 106 105  CO2 24 24  GLUCOSE 93 114*  BUN 14 8  CREATININE 0.48 0.48  CALCIUM 9.1 8.9   Liver Function Tests: Recent Labs  Lab 08/17/18 1108 08/22/18 0410  AST 18 27  ALT 24 36  ALKPHOS 73 56   BILITOT 0.8 0.7  PROT 7.5 6.9  ALBUMIN 4.2 3.6    CBC: Recent Labs  Lab 08/17/18 1108 08/21/18 1206 08/22/18 0410  WBC 8.4  --  13.9*  NEUTROABS 4.9  --  11.2*  HGB 14.5 14.3 13.6  HCT 45.7 46.4* 43.2  MCV 85.4  --  86.4  PLT 282  --  303    CBG: Recent Labs  Lab 08/21/18 2352 08/22/18 0405 08/22/18 0723 08/22/18 1131 08/22/18 1532  GLUCAP 167* 106* 155* 108* 110*    Principal Problem:   Severe obesity (BMI >= 40) (HCC) Active Problems:   Diabetes mellitus type 2 in obese (HCC)   Hyperlipidemia   Fatty liver   Gastric bypass status for obesity   Time coordinating discharge: 15 min  Signed:  Minerva AreolaEric  Elson ClanM Pauline Pegues, MD [facility], [place] [phone] 08/23/2018, 4:54 PM

## 2018-08-27 ENCOUNTER — Telehealth (HOSPITAL_COMMUNITY): Payer: Self-pay | Admitting: *Deleted

## 2018-08-27 NOTE — Telephone Encounter (Signed)
Patient called to discuss post bariatric surgery follow up questions.  See below:   1.  Tell me about your pain and pain management? Sore; taking pain meds at night.  Yesterday had more pain but did more walking.  2.  Let's talk about fluid intake.  How much total fluid are you taking in? She told me about 22/23 oz a day but upon talking this out, we realize she's underestimated.  Shes taking a bottle and a half of protein and sipping protein water, jello, broth and popsicles frequently.  I estimate she's taking in closer to 40 oz this time.  She said she'll keep up with it a little closer.  She's voiding frequently and her sugars are in the low 100.  3.  How much protein have you taken in the last 2 days? approx 45 gm daily but increasing  4.  Have you had nausea?  Tell me about when have experienced nausea and what you did to help? no  5.  Has the frequency or color changed with your urine? Clear yellow  6.  Tell me what your incisions look like? Black and blue but had more internal work done- scar tissue  7.  Have you been passing gas? BM? Yes passing gas and having small bms.  Some diarrhea on 2nd day at home  8.  If a problem or question were to arise who would you call?  Do you know contact numbers for [facility], [facility], and [facility]? yes  9.  How has the walking going? Moving often and increasing amts  10.  How are your vitamins and calcium going?  How are you taking them? Taking Celebrate daily and 1500mg  of Ca daily

## 2018-09-04 ENCOUNTER — Ambulatory Visit (INDEPENDENT_AMBULATORY_CARE_PROVIDER_SITE_OTHER): Payer: BC Managed Care – PPO | Admitting: Endocrinology

## 2018-09-04 ENCOUNTER — Other Ambulatory Visit: Payer: Self-pay

## 2018-09-04 ENCOUNTER — Encounter: Payer: BC Managed Care – PPO | Attending: General Surgery | Admitting: Skilled Nursing Facility1

## 2018-09-04 ENCOUNTER — Encounter: Payer: Self-pay | Admitting: Endocrinology

## 2018-09-04 ENCOUNTER — Telehealth: Payer: Self-pay | Admitting: Endocrinology

## 2018-09-04 VITALS — BP 128/72 | HR 94 | Ht 63.0 in | Wt 216.2 lb

## 2018-09-04 DIAGNOSIS — E1169 Type 2 diabetes mellitus with other specified complication: Secondary | ICD-10-CM | POA: Diagnosis present

## 2018-09-04 DIAGNOSIS — E669 Obesity, unspecified: Secondary | ICD-10-CM | POA: Insufficient documentation

## 2018-09-04 MED ORDER — DAPAGLIFLOZIN PROPANEDIOL 10 MG PO TABS: 10.0000 mg | ORAL_TABLET | Freq: Every day | ORAL | 3 refills | Status: DC

## 2018-09-04 NOTE — Patient Instructions (Addendum)
Please resume the [place], and:  Take the novolog, just 2 units at a time, and only if the blood sugar is over 200.  check your blood sugar once a day.  vary the time of day when you check, between before the 3 meals, and at bedtime.  also check if you have symptoms of your blood sugar being too high or too low.  please keep a record of the readings and bring it to your next appointment here (or you can bring the meter itself).  You can write it on any piece of paper.  please call us sooner if your blood sugar goes below 70, or if you have a lot of readings over 200. Please come back for a follow-up appointment in 2 months.

## 2018-09-04 NOTE — Progress Notes (Signed)
2 Week Post-Operative Nutrition Class   Patient was seen on 04/03/18 for Post-Operative Nutrition education at the [facility].   Pt was extremely anxious about her weight wringing her hands stating she has not lost enough. Pt states she weighs every day. Dietitian educated the pt on weight and the pit falls of weighing daily.    Surgery date:  08/21/2018 Surgery type: RYGB Start weight at [facility]: 229 Weight today: 214.8   Body Composition Scale 09/04/2018  Total Body Fat % 43.5  Visceral Fat 15  Fat-Free Mass % 56.4   Total Body Water % 42.7   Muscle-Mass lbs 29.2  Body Fat Displacement          Torso  lbs 57.8         Left Leg  lbs 11.5         Right Leg  lbs 11.5         Left Arm  lbs 5.7         Right Arm   lbs 5.7     The following the learning objectives were met by the patient during this course:  Identifies Phase 3 (Soft, High Proteins) Dietary Goals and will begin from 2 weeks post-operatively to 2 months post-operatively  Identifies appropriate sources of fluids and proteins   States protein recommendations and appropriate sources post-operatively  Identifies the need for appropriate texture modifications, mastication, and bite sizes when consuming solids  Identifies appropriate multivitamin and calcium sources post-operatively  Describes the need for physical activity post-operatively and will follow MD recommendations  States when to call healthcare provider regarding medication questions or post-operative complications   Handouts given during class include:  Phase 3A: Soft, High Protein Diet Handout   Follow-Up Plan: Patient will follow-up at [facility] in 6 weeks for 2 month post-op nutrition visit for diet advancement per MD.

## 2018-09-04 NOTE — Telephone Encounter (Signed)
FYI, Patient is coming in for appointment today-patient is having trouble with her meter. Also patient states she is no longer taking Levemire long acting, she was switched to  Novolog slow acting and uses a scale to determine how many units to take. The highest units she has taken is 4 units.

## 2018-09-04 NOTE — Progress Notes (Signed)
Subjective:    Patient ID: Lisa [name], female    DOB: 03-Sep-1960, 58 y.o.   MRN: [id number]  HPI Pt returns for f/u of diabetes mellitus:  DM type: Insulin-requiring type 2 Dx'ed: 2005 Complications: gastroparesis and DN Therapy: insulin since 2011.   GDM: 1986 and 1989 DKA: never Severe hypoglycemia: never Pancreatitis: never Pancreatic imaging: normal on 2020 US Other: she did not tolerate metformin (nausea)  Interval history: She had gastric bypass recently.  She has lost 10 lbs so far.  Pt says her meter does not work.  She averages 0-6 total units per day.  she brings her meter with her cbg's which I have reviewed today.  cbg varies from 40-137.  She takes 3 units for any cbg over 120.   Past Medical History:  Diagnosis Date  . Diabetes mellitus without complication (HCC)    type II  . GERD (gastroesophageal reflux disease)   . Hyperlipidemia   . Insomnia   . Restless leg     Past Surgical History:  Procedure Laterality Date  . ABDOMINAL HYSTERECTOMY  1999   adhesions  . APPENDECTOMY    . CESAREAN SECTION  1987  . ECTOPIC PREGNANCY SURGERY  1992  . GASTRIC ROUX-EN-Y N/A 08/21/2018   Procedure: LAPAROSCOPIC ROUX-EN-Y GASTRIC BYPASS WITH UPPER ENDOSCOPY, LYSIS OF ADHESIONS X1HOUR, ERAS PATHWAY;  Surgeon: Gaynelle AduWilson, Eric, MD;  Location: [facility];  Service: General;  Laterality: N/A;  . OTHER SURGICAL HISTORY  2004   "bowel obstruction-- open wound"  . TUBAL LIGATION  1991    Social History   Socioeconomic History  . Marital status: Married    Spouse name: Not on file  . Number of children: Not on file  . Years of education: Not on file  . Highest education level: Not on file  Occupational History  . Not on file  Social Needs  . Financial resource strain: Not on file  . Food insecurity    Worry: Not on file    Inability: Not on file  . Transportation needs    Medical: Not on file    Non-medical: Not on file  Tobacco Use  . Smoking status: Never Smoker   . Smokeless tobacco: Never Used  Substance and Sexual Activity  . Alcohol use: Yes    Comment: special occasions "couple times a year"  . Drug use: No  . Sexual activity: Yes  Lifestyle  . Physical activity    Days per week: Not on file    Minutes per session: Not on file  . Stress: Not on file  Relationships  . Social Musicianconnections    Talks on phone: Not on file    Gets together: Not on file    Attends religious service: Not on file    Active member of club or organization: Not on file    Attends meetings of clubs or organizations: Not on file    Relationship status: Not on file  . Intimate partner violence    Fear of current or ex partner: Not on file    Emotionally abused: Not on file    Physically abused: Not on file    Forced sexual activity: Not on file  Other Topics Concern  . Not on file  Social History Narrative   Works as a Systems developerspecial Ed Teacher for [employer]   [place], [place]   Married   Grown children (2 sons one in [place] he has 2 sons and a daughter,  another son in [place] and has one child)    Current Outpatient Medications on File Prior to Visit  Medication Sig Dispense Refill  . diphenhydrAMINE (BENADRYL) 50 MG tablet Take 100 mg by mouth at bedtime.    Marland Kitchen glucose blood (ONETOUCH VERIO) test strip Test three times daily to check blood sugar.  DXE11.9 200 each 6  . ondansetron (ZOFRAN-ODT) 4 MG disintegrating tablet Take 1 tablet (4 mg total) by mouth every 6 (six) hours as needed for nausea or vomiting. 20 tablet 0  . ONE TOUCH LANCETS MISC Test as directed three times daily to check blood sugar.  DXE11.9 200 each 6  . traMADol (ULTRAM) 50 MG tablet Take 1 tablet (50 mg total) by mouth every 6 (six) hours as needed (pain). 10 tablet 0   No current facility-administered medications on file prior to visit.     No Known Allergies  Family History  Problem Relation Age of Onset  . Diabetes Mother        type II  . Hyperthyroidism Mother    . Diabetes Father        type II  . Diabetes Paternal Grandmother     BP 128/72 (BP Location: Left Arm, Patient Position: Sitting, Cuff Size: Large)   Pulse 94   Ht 5\' 3"  (1.6 m)   Wt 216 lb 3.2 oz (98.1 kg)   SpO2 97%   BMI 38.30 kg/m   Review of Systems Denies n/v.      Objective:   Physical Exam VITAL SIGNS:  See vs page GENERAL: no distress Pulses: dorsalis pedis intact bilat.   MSK: no deformity of the feet CV: no leg edema Skin:  no ulcer on the feet.  normal color and temp on the feet. Neuro: sensation is intact to touch on the feet   Lab Results  Component Value Date   CREATININE 0.48 08/22/2018   BUN 8 08/22/2018   NA 138 08/22/2018   K 4.3 08/22/2018   CL 105 08/22/2018   CO2 24 08/22/2018    Lab Results  Component Value Date   HGBA1C 6.9 (H) 08/17/2018      Assessment & Plan:  Bariatric surg status, new to me: slow progress so far.  Type 2 DM: well-controlled.  She can d/c insulin.  Hypoglycemia: this limits aggressiveness of glycemic control.    Patient Instructions  Please resume the Farxiga, and:  Take the novolog, just 2 units at a time, and only if the blood sugar is over 200.  check your blood sugar once a day.  vary the time of day when you check, between before the 3 meals, and at bedtime.  also check if you have symptoms of your blood sugar being too high or too low.  please keep a record of the readings and bring it to your next appointment here (or you can bring the meter itself).  You can write it on any piece of paper.  please call us sooner if your blood sugar goes below 70, or if you have a lot of readings over 200. Please come back for a follow-up appointment in 2 months.

## 2018-09-06 ENCOUNTER — Ambulatory Visit: Payer: BC Managed Care – PPO | Admitting: Endocrinology

## 2018-09-10 ENCOUNTER — Telehealth: Payer: Self-pay | Admitting: Skilled Nursing Facility1

## 2018-09-10 NOTE — Telephone Encounter (Signed)
Pt states she usally (before surgery) would only have 1 bowel movement a week.

## 2018-09-10 NOTE — Telephone Encounter (Signed)
RD called pt to verify fluid intake once starting soft, solid proteins 2 week post-bariatric surgery.   Daily Fluid intake: 64+ Daily Protein intake: 60+  Concerns/issues:   States she is constipated: last bowel movement yesterday solid mass, and has spent hours in the bathroom. Tried miralax today.   Advised her to speak with her surgeon.   Try kefir in the yogurt ailse

## 2018-09-11 ENCOUNTER — Ambulatory Visit: Payer: BC Managed Care – PPO

## 2018-10-18 ENCOUNTER — Encounter: Payer: BC Managed Care – PPO | Attending: General Surgery | Admitting: Skilled Nursing Facility1

## 2018-10-18 ENCOUNTER — Other Ambulatory Visit: Payer: Self-pay

## 2018-10-18 DIAGNOSIS — E1169 Type 2 diabetes mellitus with other specified complication: Secondary | ICD-10-CM | POA: Insufficient documentation

## 2018-10-18 DIAGNOSIS — E669 Obesity, unspecified: Secondary | ICD-10-CM | POA: Diagnosis present

## 2018-10-18 NOTE — Patient Instructions (Signed)
-  Continue to aim for a minimum of 64 fluid ounces 7 days a week with at least 30 ounces being plain water  -Eat non-starchy vegetables 2 times a day 7 days a week  -Start out with soft cooked vegetables today and tomorrow; if tolerated begin to eat raw vegetables or cooked including salads  -Eat your 3 ounces of protein first then start in on your non-starchy vegetables; once you understand how much of your meal leads to satisfaction and not full while still eating 3 ounces of protein and non-starchy vegetables you can eat them in any order   -Continue to aim for 30 minutes of activity at least 5 times a week  -Do NOT cook with/add to your food: alfredo sauce, cheese sauce, barbeque sauce, ketchup, fat back, butter, bacon grease, grease, Crisco   

## 2018-10-18 NOTE — Progress Notes (Signed)
Bariatric Follow-Up Visit 2 Months Medical Nutrition Therapy  Appt Start Time: 3:59 End Time: 4:30 Primary Concerns Today: Bariatric Surgery Nutrition Follow Up   Bariatric Surgery Type: RYGB   Surgery Date: 08/21/2018   NUTRITION ASSESSMENT   Anthropometrics  Surgery date:  08/21/2018 Surgery type: RYGB [facility] Weight today: 201.1 Weight change:  (since previous nutrition appointment)   Body Composition Results Body Composition Scale 10/18/2018  Total Body Fat % 41.7  Visceral Fat 14  Fat-Free Mass % 58.2   Total Body Water % 43.6   Muscle-Mass lbs 29.1  Body Fat Displacement          Torso  lbs 51.9         Left Leg  lbs 10.3         Right Leg  lbs 10.3         Left Arm  lbs 5.1         Right Arm   lbs 5.1     Psychosocial/Lifestyle Diagnoses:  Diabetes  Gastroparesis   Notable Signs/Symptoms Reflux Headaches with high blood sugars  Pt states she did not try kefir. Pt states she is having bowel movements multiple times a week. Pt states she gets to working so she forgets to drink. Pt states she recognizes she eats too fast because it hurts this occurring at work. Pt states he is still taking the protonix.  Pt states she really wants to add in carbs dietitian she wait until 2 months from now.  Pt states her fasting numbers have been 90-102 checking a couple times a week.   Medications: farxiga Labs:  Supplements:  Multi and calcium   24-Hr Dietary Recall First Meal: greek yogurt Snack:  Second Meal: chicken or Malaw[place]urkey 3 ounces and beans Snack: protein shake sometimes  Third Meal: chicken or pork with beans  Snack: sugar free jello Beverages: water with crystal light   Estimated Daily Fluid Intake: 50+  oz Estimated Daily Protein Intake: 60+ g   Physical Activity  Current average weekly physical activity: ADL's    Signs/Symptoms  Using straws: no Drinking while eating: no Chewing/swallowing difficulties: no Changes in vision:  no Changes to mood/headaches: no Hair loss/changes to skin/nails: no Difficulty focusing/concentrating: no Sweating: no Dizziness/lightheadedness: no Palpitations: no Carbonated/caffeinated beverages: no N/V/D/C/Gas: colace daily  Abdominal pain:no Dumping syndrome: no     NUTRITION DIAGNOSIS  Overweight/obesity ([place]-3.3) related to past poor dietary habits and physical inactivity as evidenced by patient w/ completed Bariatric surgery following dietary guidelines for continued weight loss and healthy nutrition status.     NUTRITION INTERVENTION Nutrition counseling (C-1) and education (E-2) to facilitate bariatric surgery goals, including:  Diet advancement to the next phase now including non-starchy vegetables  The importance of consuming adequate calories as well as certain nutrients daily due to the body's need for essential vitamins, minerals, and fats  The importance of daily physical activity and to reach a goal of at least 150 minutes of moderate to vigorous physical activity weekly (or as directed by their physician) due to benefits such as increased musculature and improved lab values  Pt Chosen Goals:  -Continue to aim for a minimum of 64 fluid ounces 7 days a week with at least 30 ounces being plain water -Eat non-starchy vegetables 2 times a day 7 days a week -Start out with soft cooked vegetables today and tomorrow; if tolerated begin to eat raw vegetables or cooked including salads -Eat your 3 ounces of  protein first then start in on your non-starchy vegetables; once you understand how much of your meal leads to satisfaction and not full while still eating 3 ounces of protein and non-starchy vegetables you can eat them in any order  -Continue to aim for 30 minutes of activity at least 5 times a week -Do NOT cook with/add to your food: alfredo sauce, cheese sauce, barbeque sauce, ketchup, fat back, butter, bacon grease, grease, Crisco   Handouts Provided Include  Non  starchy veggies   Readiness for Change: REady  Demonstrated degree of understanding via: Teach Back       MONITORING & EVALUATION Dietary intake, weekly physical activity, and body weight follow up in 2 months

## 2018-10-31 ENCOUNTER — Other Ambulatory Visit: Payer: Self-pay

## 2018-10-31 DIAGNOSIS — Z20822 Contact with and (suspected) exposure to covid-19: Secondary | ICD-10-CM

## 2018-11-02 LAB — NOVEL CORONAVIRUS, NAA: SARS-CoV-2, NAA: NOT DETECTED

## 2018-11-05 ENCOUNTER — Other Ambulatory Visit: Payer: Self-pay

## 2018-11-07 ENCOUNTER — Encounter: Payer: Self-pay | Admitting: Endocrinology

## 2018-11-07 ENCOUNTER — Other Ambulatory Visit: Payer: Self-pay

## 2018-11-07 ENCOUNTER — Ambulatory Visit (INDEPENDENT_AMBULATORY_CARE_PROVIDER_SITE_OTHER): Payer: BC Managed Care – PPO | Admitting: Endocrinology

## 2018-11-07 VITALS — BP 112/78 | HR 92 | Ht 63.0 in | Wt 194.6 lb

## 2018-11-07 DIAGNOSIS — E669 Obesity, unspecified: Secondary | ICD-10-CM

## 2018-11-07 DIAGNOSIS — E1169 Type 2 diabetes mellitus with other specified complication: Secondary | ICD-10-CM

## 2018-11-07 DIAGNOSIS — Z9884 Bariatric surgery status: Secondary | ICD-10-CM | POA: Diagnosis not present

## 2018-11-07 LAB — POCT GLYCOSYLATED HEMOGLOBIN (HGB A1C): Hemoglobin A1C: 5.7 % — AB (ref 4.0–5.6)

## 2018-11-07 MED ORDER — DAPAGLIFLOZIN PROPANEDIOL 10 MG PO TABS: 10.0000 mg | ORAL_TABLET | Freq: Every day | ORAL | 3 refills | Status: DC

## 2018-11-07 NOTE — Patient Instructions (Addendum)
Please continue the same Farxiga check your blood sugar once a day.  vary the time of day when you check, between before the 3 meals, and at bedtime.  also check if you have symptoms of your blood sugar being too high or too low.  please keep a record of the readings and bring it to your next appointment here (or you can bring the meter itself).  You can write it on any piece of paper.  please call us sooner if your blood sugar goes below 70, or if you have a lot of readings over 200. Please come back for a follow-up appointment in 3-4 months.

## 2018-11-07 NOTE — Progress Notes (Signed)
Subjective:    Patient ID: Lisa [name], female    DOB: [birth year], 58 y.o.   MRN: [id number]  HPI Pt returns for f/u of diabetes mellitus:  DM type: 2 Dx'ed: 2005 Complications: gastroparesis and DN Therapy: Farxiga GDM: 1986 and 1989 DKA: never Severe hypoglycemia: never Pancreatitis: never Pancreatic imaging: normal on 2020 [place] Other: she did not tolerate metformin (nausea); she took insulin 2011-2020; she has gastric bypass in 2020.   Interval history: She has lost 34 lbs so far.  Pt says cbg's are well-controlled.  She tolerate [place] well. She seldom has vomiting.   Past Medical History:  Diagnosis Date  . Diabetes mellitus without complication (HCC)    type II  . GERD (gastroesophageal reflux disease)   . Hyperlipidemia   . Insomnia   . Restless leg     Past Surgical History:  Procedure Laterality Date  . ABDOMINAL HYSTERECTOMY  1999   adhesions  . APPENDECTOMY    . CESAREAN SECTION  1987  . ECTOPIC PREGNANCY SURGERY  1992  . GASTRIC ROUX-EN-Y N/A 08/21/2018   Procedure: LAPAROSCOPIC ROUX-EN-Y GASTRIC BYPASS WITH UPPER ENDOSCOPY, LYSIS OF ADHESIONS X1HOUR, ERAS PATHWAY;  Surgeon: Gaynelle Adu, MD;  Location: [facility];  Service: General;  Laterality: N/A;  . OTHER SURGICAL HISTORY  2004   "bowel obstruction-- open wound"  . TUBAL LIGATION  1991    Social History   Socioeconomic History  . Marital status: Married    Spouse name: Not on file  . Number of children: Not on file  . Years of education: Not on file  . Highest education level: Not on file  Occupational History  . Not on file  Social Needs  . Financial resource strain: Not on file  . Food insecurity    Worry: Not on file    Inability: Not on file  . Transportation needs    Medical: Not on file    Non-medical: Not on file  Tobacco Use  . Smoking status: Never Smoker  . Smokeless tobacco: Never Used  Substance and Sexual Activity  . Alcohol use: Yes    Comment: special occasions "couple  times a year"  . Drug use: No  . Sexual activity: Yes  Lifestyle  . Physical activity    Days per week: Not on file    Minutes per session: Not on file  . Stress: Not on file  Relationships  . Social Musician on phone: Not on file    Gets together: Not on file    Attends religious service: Not on file    Active member of club or organization: Not on file    Attends meetings of clubs or organizations: Not on file    Relationship status: Not on file  . Intimate partner violence    Fear of current or ex partner: Not on file    Emotionally abused: Not on file    Physically abused: Not on file    Forced sexual activity: Not on file  Other Topics Concern  . 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 docusate sodium (COLACE) 100  MG capsule Take 100 mg by mouth 2 (two) times daily.    Marland Kitchen glucose blood (ONETOUCH VERIO) test strip Test three times daily to check blood sugar.  DXE11.9 200 each 6  . ONE TOUCH LANCETS MISC Test as directed three times daily to check blood sugar.  DXE11.9 200 each 6  . pantoprazole (PROTONIX) 20 MG tablet Take 20 mg by mouth daily.     No current facility-administered medications on file prior to visit.     No Known Allergies  Family History  Problem Relation Age of Onset  . Diabetes Mother        type II  . Hyperthyroidism Mother   . Diabetes Father        type II  . Diabetes Paternal Grandmother     BP 112/78 (BP Location: Left Arm, Patient Position: Sitting, Cuff Size: Normal)   Pulse 92   Ht 5\' 3"  (1.6 m)   Wt 194 lb 9.6 oz (88.3 kg)   SpO2 98%   BMI 34.47 kg/m    Review of Systems Denies polyuria.      Objective:   Physical  Exam VITAL SIGNS:  See vs page GENERAL: no distress Pulses: dorsalis pedis intact bilat.   MSK: no deformity of the feet CV: no leg edema Skin:  no ulcer on the feet.  normal color and temp on the feet. Neuro: sensation is intact to touch on the feet.    Lab Results  Component Value Date   HGBA1C 5.7 (A) 11/07/2018       Assessment & Plan:  Type 2 DM; with DN: well-controlled Bariatric surg status: good progress.   Patient Instructions  Please continue the same Farxiga check your blood sugar once a day.  vary the time of day when you check, between before the 3 meals, and at bedtime.  also check if you have symptoms of your blood sugar being too high or too low.  please keep a record of the readings and bring it to your next appointment here (or you can bring the meter itself).  You can write it on any piece of paper.  please call us sooner if your blood sugar goes below 70, or if you have a lot of readings over 200. Please come back for a follow-up appointment in 3-4 months.

## 2018-11-08 LAB — PHOSPHORUS: Phosphorus: 3.7 mg/dL (ref 2.3–4.6)

## 2018-11-08 LAB — HEPATIC FUNCTION PANEL
ALT: 23 U/L (ref 0–35)
AST: 21 U/L (ref 0–37)
Albumin: 4.1 g/dL (ref 3.5–5.2)
Alkaline Phosphatase: 64 U/L (ref 39–117)
Bilirubin, Direct: 0 mg/dL (ref 0.0–0.3)
Total Bilirubin: 0.7 mg/dL (ref 0.2–1.2)
Total Protein: 7.2 g/dL (ref 6.0–8.3)

## 2018-11-08 LAB — BASIC METABOLIC PANEL
BUN: 9 mg/dL (ref 6–23)
CO2: 25 mEq/L (ref 19–32)
Calcium: 9.5 mg/dL (ref 8.4–10.5)
Chloride: 103 mEq/L (ref 96–112)
Creatinine, Ser: 0.56 mg/dL (ref 0.40–1.20)
GFR: 111.57 mL/min (ref >60.00)
Glucose, Bld: 96 mg/dL (ref 70–99)
Potassium: 3.5 mEq/L (ref 3.5–5.1)
Sodium: 139 mEq/L (ref 135–145)

## 2018-11-08 LAB — TSH: TSH: 1.24 u[IU]/mL (ref 0.35–4.50)

## 2018-11-08 LAB — MICROALBUMIN / CREATININE URINE RATIO
Creatinine,U: 130.8 mg/dL
Microalb Creat Ratio: 4.2 mg/g (ref 0.0–30.0)
Microalb, Ur: 5.4 mg/dL — ABNORMAL HIGH (ref 0.0–1.9)

## 2018-11-08 LAB — IBC PANEL
Iron: 45 ug/dL (ref 42–145)
Saturation Ratios: 15.4 % — ABNORMAL LOW (ref 20.0–50.0)
Transferrin: 209 mg/dL — ABNORMAL LOW (ref 212.0–360.0)

## 2018-11-08 LAB — VITAMIN D 25 HYDROXY (VIT D DEFICIENCY, FRACTURES): VITD: 16.56 ng/mL — ABNORMAL LOW (ref 30.00–100.00)

## 2018-11-08 LAB — MAGNESIUM: Magnesium: 2.1 mg/dL (ref 1.5–2.5)

## 2018-11-08 LAB — VITAMIN B12: Vitamin B-12: 308 pg/mL (ref 211–911)

## 2018-11-08 LAB — T4, FREE: Free T4: 1.03 ng/dL (ref 0.60–1.60)

## 2018-11-12 ENCOUNTER — Encounter: Payer: Self-pay | Admitting: Family Medicine

## 2018-11-12 LAB — VITAMIN A: Vitamin A (Retinoic Acid): 27 ug/dL — ABNORMAL LOW (ref 38–98)

## 2018-11-16 ENCOUNTER — Other Ambulatory Visit: Payer: Self-pay

## 2018-11-16 ENCOUNTER — Ambulatory Visit (INDEPENDENT_AMBULATORY_CARE_PROVIDER_SITE_OTHER): Payer: BC Managed Care – PPO | Admitting: Family Medicine

## 2018-11-16 ENCOUNTER — Encounter: Payer: Self-pay | Admitting: Family Medicine

## 2018-11-16 VITALS — Temp 98.0°F | Ht 63.0 in | Wt 188.0 lb

## 2018-11-16 DIAGNOSIS — G2581 Restless legs syndrome: Secondary | ICD-10-CM

## 2018-11-16 MED ORDER — PRAMIPEXOLE DIHYDROCHLORIDE 0.25 MG PO TABS: ORAL_TABLET | ORAL | 2 refills | Status: DC

## 2018-11-16 NOTE — Progress Notes (Signed)
Chief Complaint  Patient presents with  . Follow-up    medication questions    Subjective: Patient is a 58 y.o. female here for medication questions. Due to COVID-19 pandemic, we are interacting via web portal for an electronic face-to-face visit. I verified patient's ID using 2 identifiers. Patient agreed to proceed with visit via this method. Patient is at home, I am at office. Patient and I are present for visit.   Had gastric bypass in July.  Ever since that time, she has been unable to tolerate her oral Requip for restless leg syndrome.  It has been working quite well.  She is curious if there is anything else that is bothersome.  It is really affecting her sleep.  ROS: Neuro: +shaking legs  Past Medical History:  Diagnosis Date  . Diabetes mellitus without complication ([place])    type II  . GERD (gastroesophageal reflux disease)   . Hyperlipidemia   . Insomnia   . Restless leg     Objective: Temp 98 F (36.7 C) (Oral)   Ht 5\' 3"  (1.6 m)   Wt 188 lb (85.3 kg)   BMI 33.30 kg/m  No conversational dyspnea Age appropriate judgment and insight Nml affect and mood  Assessment and Plan: RLS (restless legs syndrome) - Plan: pramipexole (MIRAPEX) 0.25 MG tablet  Take half a tab daily for 1 week and then increase to a full tab. Follow-up in 2-3 weeks.  Let us know if there are issues.  The patient voiced understanding and agreement to the plan.  Pace, DO 11/16/18  5:14 PM

## 2018-12-12 ENCOUNTER — Ambulatory Visit: Payer: BC Managed Care – PPO | Admitting: Skilled Nursing Facility1

## 2018-12-19 ENCOUNTER — Other Ambulatory Visit: Payer: Self-pay

## 2018-12-19 ENCOUNTER — Encounter: Payer: BC Managed Care – PPO | Attending: General Surgery | Admitting: Dietician

## 2018-12-19 DIAGNOSIS — E669 Obesity, unspecified: Secondary | ICD-10-CM | POA: Insufficient documentation

## 2018-12-19 DIAGNOSIS — E1169 Type 2 diabetes mellitus with other specified complication: Secondary | ICD-10-CM | POA: Insufficient documentation

## 2018-12-19 NOTE — Patient Instructions (Signed)
   Aim to have a high-protein snack packed every day.   Aim to increase physical activity daily.

## 2018-12-19 NOTE — Progress Notes (Signed)
Bariatric Nutrition Follow-Up Visit Medical Nutrition Therapy  Appt Start Time: 3:00pm   End Time: 3:30pm  4 Months Post-Operative RYGB Surgery Surgery Date: 08/21/2018  Pt's Expectations of Surgery/ Goals: to control diabetes and lose weight and have more energy and be more active    NUTRITION ASSESSMENT  Anthropometrics  Start weight at [facility]: 229 lbs (date: 06/13/2018) Today's weight: 185.1 lbs Weight change: -16 lbs (since previous nutrition visit)  Body Composition Scale 10/18/2018 12/19/2018  Weight  lbs 201.1 185.1  BMI  32.4  Total Body Fat  % 41.7 39.3     Visceral Fat 14 12  Fat-Free Mass  % 58.2 60.6     Total Body Water  % 43.6 44.8     Muscle-Mass  lbs 29.1 28.9  Body Fat Displacement --- ---         Torso  lbs 51.9 45         Left Leg  lbs 10.3 9         Right Leg  lbs 10.3 9         Left Arm  lbs 5.1 4.5         Right Arm  lbs 5.1 4.5    Lifestyle & Dietary Hx Will have meat daily, such as chicken or steak. Veggies mostly include carrots w/ ranch. States she knows what she needs to do, but is having a harder time staying more dedicated to her protein, diet, and fluid goals since she has been working more lately.   Estimated daily fluid intake: 64+ oz Estimated daily protein intake: unknown (pt states "not enough")  Supplements: MVI and calcium  Current average weekly physical activity: none structured    24-Hr Dietary Recall First Meal: yoplait [place] light and fit  Snack: -  Second Meal: meat + carrots  Snack: -  Third Meal: soup  Snack: - Beverages: water, Crystal Light   Post-Op Goals/ Signs/ Symptoms Using straws: no Drinking while eating: no Chewing/swallowing difficulties: no Changes in vision: no Changes to mood/headaches: no Hair loss/changes to skin/nails: no Difficulty focusing/concentrating: no Sweating: no Dizziness/lightheadedness: no Palpitations: no  Carbonated/caffeinated beverages: no N/V/D/C/Gas: constipation  Abdominal pain:  no Dumping syndrome: no   NUTRITION DIAGNOSIS  Overweight/obesity ([place]-3.3) related to past poor dietary habits and physical inactivity as evidenced by completed bariatric surgery and following dietary guidelines for continued weight loss and healthy nutrition status.   NUTRITION INTERVENTION Nutrition counseling (C-1) and education (E-2) to facilitate bariatric surgery goals, including: . The importance of consuming adequate calories as well as certain nutrients daily due to the body's need for essential vitamins, minerals, and fats . The importance of daily physical activity and to reach a goal of at least 150 minutes of moderate to vigorous physical activity weekly (or as directed by their physician) due to benefits such as increased musculature and improved lab values  Pt Chosen Goals  Aim to have a high-protein snack packed every day.   Aim to increase physical activity daily.   Learning Style & Readiness for Change Teaching method utilized: Visual & Auditory  Demonstrated degree of understanding via: Teach Back  Barriers to learning/adherence to lifestyle change: None Identified    MONITORING & EVALUATION Dietary intake, weekly physical activity, body weight, and goals in 2 months.  Next Steps Patient is to follow-up in 2 months for 6 month post-op follow-up.

## 2019-01-19 ENCOUNTER — Other Ambulatory Visit: Payer: Self-pay | Admitting: Family Medicine

## 2019-01-19 DIAGNOSIS — G2581 Restless legs syndrome: Secondary | ICD-10-CM

## 2019-02-19 ENCOUNTER — Encounter: Payer: Self-pay | Admitting: Family Medicine

## 2019-02-19 ENCOUNTER — Ambulatory Visit (INDEPENDENT_AMBULATORY_CARE_PROVIDER_SITE_OTHER): Payer: BC Managed Care – PPO | Admitting: Family Medicine

## 2019-02-19 ENCOUNTER — Other Ambulatory Visit: Payer: Self-pay

## 2019-02-19 DIAGNOSIS — R519 Headache, unspecified: Secondary | ICD-10-CM

## 2019-02-19 DIAGNOSIS — G2581 Restless legs syndrome: Secondary | ICD-10-CM | POA: Diagnosis not present

## 2019-02-19 MED ORDER — RIZATRIPTAN BENZOATE 10 MG PO TBDP: 10.0000 mg | ORAL_TABLET | ORAL | 0 refills | Status: DC | PRN

## 2019-02-19 MED ORDER — PRAMIPEXOLE DIHYDROCHLORIDE 0.25 MG PO TABS: ORAL_TABLET | ORAL | 0 refills | Status: DC

## 2019-02-19 MED ORDER — MELOXICAM 15 MG PO TABS: ORAL_TABLET | ORAL | 0 refills | Status: DC

## 2019-02-19 NOTE — Progress Notes (Signed)
CC: HA  Lisa [name] is a 59 y.o. female here for evaluation of an acute headache. Due to COVID-19 pandemic, we are interacting via web portal for an electronic face-to-face visit. I verified patient's ID using 2 identifiers. Patient agreed to proceed with visit via this method. Patient is at home, I am at office. Patient and I are present for visit.   Duration: 8 days Laterality: bilateral Quality: aching Severity: 8/10 Associated symptoms: photophobia, nausea Therapies tried: Tylenol Hx of migraines: Yes.  No neurologic s/s's.  No vision changes.   ROS:  Neuro: +HA MSK: +chronic neck pain  Past Medical History:  Diagnosis Date  . Diabetes mellitus without complication (HCC)    type II  . GERD (gastroesophageal reflux disease)   . Hyperlipidemia   . Insomnia   . Restless leg    Exam No conversational dyspnea Age appropriate judgment and insight Nml affect and mood   Acute intractable headache, unspecified headache type - Plan: rizatriptan (MAXALT-MLT) 10 MG disintegrating tablet, meloxicam (MOBIC) 15 MG tablet  RLS (restless legs syndrome) - Plan: pramipexole (MIRAPEX) 0.25 MG tablet  Maxalt prn. Mobic for tension HA/neck pain. Neck stretches/exercises. Heat. If still having HA tomorrow, will have her arrive for Toradol injection.  Refill Mirapex.  F/u prn. The pt voiced understanding and agreement to the plan.  Jilda Roche [place], DO 02/19/19 4:54 PM

## 2019-02-20 ENCOUNTER — Ambulatory Visit: Payer: BC Managed Care – PPO | Admitting: Family Medicine

## 2019-02-20 ENCOUNTER — Other Ambulatory Visit: Payer: Self-pay

## 2019-02-20 DIAGNOSIS — R519 Headache, unspecified: Secondary | ICD-10-CM | POA: Diagnosis not present

## 2019-02-20 MED ORDER — KETOROLAC TROMETHAMINE 60 MG/2ML IM SOLN
60.0000 mg | Freq: Once | INTRAMUSCULAR | Status: AC
  Administered 2019-02-20: 60 mg via INTRAMUSCULAR

## 2019-02-20 NOTE — Addendum Note (Signed)
Addended by: Scharlene Gloss B on: 02/20/2019 01:14 PM   Modules accepted: Orders

## 2019-02-21 ENCOUNTER — Encounter: Payer: Self-pay | Admitting: Family Medicine

## 2019-02-22 ENCOUNTER — Other Ambulatory Visit: Payer: Self-pay | Admitting: Family Medicine

## 2019-02-22 MED ORDER — PREDNISONE 20 MG PO TABS: 40.0000 mg | ORAL_TABLET | Freq: Every day | ORAL | 0 refills | Status: AC

## 2019-02-25 ENCOUNTER — Encounter: Payer: BC Managed Care – PPO | Attending: General Surgery | Admitting: Skilled Nursing Facility1

## 2019-02-25 ENCOUNTER — Encounter: Payer: Self-pay | Admitting: Skilled Nursing Facility1

## 2019-02-25 ENCOUNTER — Other Ambulatory Visit: Payer: Self-pay

## 2019-02-25 DIAGNOSIS — E1169 Type 2 diabetes mellitus with other specified complication: Secondary | ICD-10-CM | POA: Diagnosis not present

## 2019-02-25 DIAGNOSIS — E669 Obesity, unspecified: Secondary | ICD-10-CM

## 2019-02-25 NOTE — Progress Notes (Signed)
Bariatric Nutrition Follow-Up Visit Medical Nutrition Therapy  Appt Start Time: 10:00 am   End Time: 10:45 am  6 Months Post-Operative RYGB Surgery Surgery Date: 08/21/2018  Pt's Expectations of Surgery/ Goals: to control diabetes and lose weight and have more energy and be more active    NUTRITION ASSESSMENT  Anthropometrics  Start weight at [facility]: 229 lbs (date: 06/13/2018) Today's weight: 176.3 lbs Weight change: -9 lbs (since previous nutrition visit)  Body Composition Scale 10/18/2018 12/19/2018 02/25/2019  Weight  lbs 201.1 185.1 176.3  BMI  32.4 30.8  Total Body Fat  % 41.7 39.3 37.8     Visceral Fat 14 12 11   Fat-Free Mass  % 58.2 60.6 62.1     Total Body Water  % 43.6 44.8 45.5     Muscle-Mass  lbs 29.1 28.9 28.8  Body Fat Displacement --- ---          Torso  lbs 51.9 45 41.3         Left Leg  lbs 10.3 9 8.2         Right Leg  lbs 10.3 9 8.2         Left Arm  lbs 5.1 4.5 4.1         Right Arm  lbs 5.1 4.5 4.1    Lifestyle & Dietary Hx Pt reports losing a close relative (uncle) over the holidays, and having to admit her father into a long-term care facility (in , 15 hours away) shortly there after. Pt reports that she is not as diligent as she should be on her diet, has not focused. Hasn't been monitoring/treacking during the last few weeks.  Pt reports she is aware of what she eats, thinks she is hitting 60 g most of the time.  Pt reports intermittent pain swallowing/nausea when eating (usually dinner), believes it is from eating too quickly. Pt reports drinking with her meals, only water. Pt reports it being a habit, and that food is not moist enough. Pt reports trying glass of milk, didn't go well. Pt reports a worry of overeating, and needs to be vigilant to avoid stress eating Pt reports taking her vitamins daily, Tums are a problem. Pt reports she will start setting an alarm on her phone. Pt reports her PCP suggested she needed an extra iron and vitamin D  supplement due to low lab values Pt reports her hair is thinning, and coming out more than usual.  Estimated daily fluid intake: 64+ oz Estimated daily protein intake: Close to 60 most days  Supplements: MVI and calcium (struggles to take Tums consistently) Current average weekly physical activity: Pt reports wanting to try physical activity (bought a bike)    24-Hr Dietary Recall First Meal: yoplait [place] light and fit, sometimes a shake  Snack: -  Second Meal: meat + carrots, tuna pack with crackers Snack: - carrots with veggie ranch light dip Third Meal: chicken, mixed veggies (snap peas, peppers, zucchini/squash, potatoes, peas)  Snack: - Beverages: water, Crystal Light   Post-Op Goals/ Signs/ Symptoms Using straws: no Drinking while eating: no Chewing/swallowing difficulties: no Changes in vision: no Changes to mood/headaches: no Hair loss/changes to skin/nails: no Difficulty focusing/concentrating: no Sweating: no Dizziness/lightheadedness: no Palpitations: no  Carbonated/caffeinated beverages: no N/V/D/C/Gas: constipation  Abdominal pain: no Dumping syndrome: no   NUTRITION DIAGNOSIS  Overweight/obesity ([place]-3.3) related to past poor dietary habits and physical inactivity as evidenced by completed bariatric surgery and following dietary guidelines for continued weight loss and healthy  nutrition status.   NUTRITION INTERVENTION Nutrition counseling (C-1) and education (E-2) to facilitate bariatric surgery goals, including: . The importance of consuming adequate calories as well as certain nutrients daily due to the body's need for essential vitamins, minerals, and fats . The importance of daily physical activity and to reach a goal of at least 150 minutes of moderate to vigorous physical activity weekly (or as directed by their physician) due to benefits such as increased musculature and improved lab values  Pt Chosen Goals  Aim to have a high-protein snack packed  every day.   Aim to increase physical activity daily.   Continue to be aware of stressful situations, and your responses to them.  Look to avoid drinking with meals.  Eat a non-starchy vegetables 2 times a day, 7 days a week  Eat a starchy vegetable at least once a day  Learning Style & Readiness for Change Teaching method utilized: Visual & Auditory  Demonstrated degree of understanding via: Teach Back  Barriers to learning/adherence to lifestyle change: None Identified    MONITORING & EVALUATION Dietary intake, weekly physical activity, body weight, and goals in 2 months.  Next Steps Patient is to follow-up in 3 months for 9 month post-op follow-up.

## 2019-03-07 ENCOUNTER — Telehealth: Payer: Self-pay

## 2019-03-07 DIAGNOSIS — E669 Obesity, unspecified: Secondary | ICD-10-CM

## 2019-03-07 DIAGNOSIS — E1169 Type 2 diabetes mellitus with other specified complication: Secondary | ICD-10-CM

## 2019-03-07 NOTE — Telephone Encounter (Signed)
Please refer to Dr. George Hugh response re: appt. Orders for A1C have been placed.

## 2019-03-07 NOTE — Telephone Encounter (Signed)
Patient would like to know if Dr. Everardo All can put lab orders in so that she can have her A1C drawn at her PCP so that she can switch her 03/15/19 appointment to a VV or she will have to cancel due to work conflict-please contact patient to discuss

## 2019-03-07 NOTE — Telephone Encounter (Signed)
OK, please to VV.  I ordered A1c

## 2019-03-07 NOTE — Telephone Encounter (Signed)
Please advise 

## 2019-03-11 ENCOUNTER — Other Ambulatory Visit: Payer: Self-pay

## 2019-03-11 DIAGNOSIS — E1169 Type 2 diabetes mellitus with other specified complication: Secondary | ICD-10-CM

## 2019-03-11 NOTE — Telephone Encounter (Signed)
Called pt and made her aware. Will forward this to our lab tech to ensure lab orders are faxed. Pt has been advised to call about their hours of operation and to determine if her insurance will cover this location. She verbalized acceptance and understanding of all information provided.

## 2019-03-11 NOTE — Telephone Encounter (Signed)
Patient called today to let us know Dr. Hollie Beach office has declined to do the patients A1C stating they can not see the order-patient lives in [place] and can not come to our lab or [facility] and is requesting some advice on how to get A1C done for her up-coming visit-please contact patient at [phone]

## 2019-03-11 NOTE — Addendum Note (Signed)
Addended by: Romero Belling on: 03/11/2019 11:33 AM   Modules accepted: Orders

## 2019-03-11 NOTE — Telephone Encounter (Signed)
I put in a1c request. please do at [facility]: [address] [place], [place] [address]

## 2019-03-11 NOTE — Telephone Encounter (Signed)
Please advise 

## 2019-03-12 LAB — HEMOGLOBIN A1C
Est. average glucose Bld gHb Est-mCnc: 117 mg/dL
Hgb A1c MFr Bld: 5.7 % — ABNORMAL HIGH (ref 4.8–5.6)

## 2019-03-13 ENCOUNTER — Other Ambulatory Visit: Payer: BC Managed Care – PPO

## 2019-03-13 ENCOUNTER — Ambulatory Visit: Payer: BC Managed Care – PPO | Admitting: Endocrinology

## 2019-03-15 ENCOUNTER — Encounter: Payer: Self-pay | Admitting: Endocrinology

## 2019-03-15 ENCOUNTER — Ambulatory Visit (INDEPENDENT_AMBULATORY_CARE_PROVIDER_SITE_OTHER): Payer: BC Managed Care – PPO | Admitting: Endocrinology

## 2019-03-15 ENCOUNTER — Other Ambulatory Visit: Payer: Self-pay

## 2019-03-15 DIAGNOSIS — E1169 Type 2 diabetes mellitus with other specified complication: Secondary | ICD-10-CM | POA: Diagnosis not present

## 2019-03-15 DIAGNOSIS — E669 Obesity, unspecified: Secondary | ICD-10-CM

## 2019-03-15 MED ORDER — RYBELSUS 3 MG PO TABS: 1.5000 mg | ORAL_TABLET | Freq: Every day | ORAL | 3 refills | Status: DC

## 2019-03-15 NOTE — Progress Notes (Signed)
Subjective:    Patient ID: Lisa [name], female    DOB: January 06, 1961, 59 y.o.   MRN: [id number]  HPI  telehealth visit today via doxy video visit.  Alternatives to telehealth are presented to this patient, and the patient agrees to the telehealth visit. Pt is advised of the cost of the visit, and agrees to this, also.   Patient is at home, and I am at the office.   Persons attending the telehealth visit: the patient and I.   Pt returns for f/u of diabetes mellitus:  DM type: 2 Dx'ed: 2005 Complications: gastroparesis and DN Therapy: Farxiga GDM: 1986 and 1989 DKA: never Severe hypoglycemia: never Pancreatitis: never Pancreatic imaging: normal on 2020 [place] Other: she did not tolerate metformin (nausea); she took insulin 2011-2020; she has gastric bypass in 2020.   Interval history: She has lost 55 lbs so far.  Denies n/v Past Medical History:  Diagnosis Date  . Diabetes mellitus without complication (HCC)    type II  . GERD (gastroesophageal reflux disease)   . Hyperlipidemia   . Insomnia   . Restless leg     Past Surgical History:  Procedure Laterality Date  . ABDOMINAL HYSTERECTOMY  1999   adhesions  . APPENDECTOMY    . CESAREAN SECTION  1987  . ECTOPIC PREGNANCY SURGERY  1992  . GASTRIC ROUX-EN-Y N/A 08/21/2018   Procedure: LAPAROSCOPIC ROUX-EN-Y GASTRIC BYPASS WITH UPPER ENDOSCOPY, LYSIS OF ADHESIONS X1HOUR, ERAS PATHWAY;  Surgeon: Gaynelle Adu, MD;  Location: [facility];  Service: General;  Laterality: N/A;  . OTHER SURGICAL HISTORY  2004   "bowel obstruction-- open wound"  . TUBAL LIGATION  1991    Social History   Socioeconomic History  . Marital status: Married    Spouse name: Not on file  . Number of children: Not on file  . Years of education: Not on file  . Highest education level: Not on file  Occupational History  . Not on file  Tobacco Use  . Smoking status: Never Smoker  . Smokeless tobacco: Never Used  Substance and Sexual Activity  . Alcohol  use: Yes    Comment: special occasions "couple times a year"  . Drug use: No  . Sexual activity: Yes  Other Topics Concern  . 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 Difficulty of Paying Living Expenses: Not on file  Food Insecurity:   . Worried About Programme researcher, broadcasting/film/video in the Last Year: Not on file  . Ran Out of Food in the Last Year: Not on file  Transportation Needs:   . Lack of Transportation (Medical): Not on file  . Lack of Transportation (Non-Medical): Not on file  Physical Activity:   . Days of Exercise per Week: Not on file  . Minutes of Exercise per Session: Not on file  Stress:   . Feeling of Stress : Not on file  Social Connections:   . Frequency of Communication with Friends and Family: Not on file  . Frequency of Social Gatherings with Friends and Family: Not on file  . Attends Religious Services: Not on file  . Active Member of Clubs  or Organizations: Not on file  . Attends Archivist Meetings: Not on file  . Marital Status: Not on file  Intimate Partner Violence:   . Fear of Current or Ex-Partner: Not on file  . Emotionally Abused: Not on file  . Physically Abused: Not on file  . Sexually Abused: Not on file    Current Outpatient Medications on File Prior to Visit  Medication Sig Dispense Refill  . dapagliflozin propanediol (FARXIGA) 10 MG TABS tablet Take 10 mg by mouth daily. 90 tablet 3  . docusate sodium (COLACE) 100 MG capsule Take 100 mg by mouth 2 (two) times daily.    Marland Kitchen glucose blood (ONETOUCH VERIO) test strip Test three times daily to check blood sugar.  DXE11.9 200 each 6  . meloxicam (MOBIC) 15 MG tablet Take 1 tab daily as needed for tension headaches. 30 tablet 0    . ONE TOUCH LANCETS MISC Test as directed three times daily to check blood sugar.  DXE11.9 200 each 6  . pantoprazole (PROTONIX) 20 MG tablet Take 20 mg by mouth daily.    . pramipexole (MIRAPEX) 0.25 MG tablet TAKE 1/2 TABLET BY MOUTH 2-3 HOURS BEFORE BEDTIME FOR 1 WEEK, THEN INCREASE TO  1 TAB NIGHTLY. 30 tablet 0  . rizatriptan (MAXALT-MLT) 10 MG disintegrating tablet Take 1 tablet (10 mg total) by mouth as needed for migraine. May repeat in 2 hours if needed 10 tablet 0   No current facility-administered medications on file prior to visit.    No Known Allergies  Family History  Problem Relation Age of Onset  . Diabetes Mother        type II  . Hyperthyroidism Mother   . Diabetes Father        type II  . Diabetes Paternal Grandmother     There were no vitals taken for this visit.    Review of Systems No increase in urination since on Farxiga.  Denies ankle swelling.   She says weight loss has plateaued.     Objective:   Physical Exam   Lab Results  Component Value Date   HGBA1C 5.7 (H) 03/11/2019   Lab Results  Component Value Date   CREATININE 0.56 11/07/2018   BUN 9 11/07/2018   NA 139 11/07/2018   K 3.5 11/07/2018   CL 103 11/07/2018   CO2 25 11/07/2018       Assessment & Plan:  Type 2 DM: well-controlled Obesity: good progress after surgery.   She declines to add Rybelsus

## 2019-04-17 ENCOUNTER — Other Ambulatory Visit: Payer: Self-pay | Admitting: Family Medicine

## 2019-04-17 DIAGNOSIS — G2581 Restless legs syndrome: Secondary | ICD-10-CM

## 2019-05-17 ENCOUNTER — Other Ambulatory Visit: Payer: Self-pay | Admitting: Family Medicine

## 2019-05-17 DIAGNOSIS — G2581 Restless legs syndrome: Secondary | ICD-10-CM

## 2019-05-27 ENCOUNTER — Ambulatory Visit: Payer: BC Managed Care – PPO | Admitting: Skilled Nursing Facility1

## 2019-06-14 ENCOUNTER — Encounter (HOSPITAL_COMMUNITY): Payer: Self-pay

## 2019-09-30 ENCOUNTER — Other Ambulatory Visit: Payer: Self-pay | Admitting: Family Medicine

## 2019-09-30 DIAGNOSIS — G2581 Restless legs syndrome: Secondary | ICD-10-CM

## 2019-10-07 ENCOUNTER — Telehealth: Payer: Self-pay

## 2019-10-07 NOTE — Telephone Encounter (Signed)
If she needs an appt, OK to sched. If she needs to get tested, OK to provide resources. Ty.

## 2019-10-07 NOTE — Telephone Encounter (Signed)
Called and spoke to her husband, they took her to UC today (early this AM) and they did a rapid test and it was negative, awaiting results of PRC test. The patient is still having flu like symptoms. UC gave her something for cough. Her husband states at this time she is sleeping and will let us know if she needs anything.  For now nothing further to do he stated.

## 2019-10-07 NOTE — Telephone Encounter (Signed)
LM requesting call back.   [facility] Night - Client TELEPHONE ADVICE RECORD AccessNurse Patient Name: Lisa [name] Gender: Female DOB: 04-06-1960 Age: 59 Y 11 M 11 D Return Phone Number: [phone] (Primary) Address[name] [address] Client [facility] Night - Client Client Site [facility] - Night Physician Carmelia Roller, - MD Contact Type Call Who Is Calling Patient / Member / Family / Caregiver Call Type Triage / Clinical Relationship To Patient Self Return Phone Number [phone] (Primary) Chief Complaint Weakness, Generalized Reason for Call Symptomatic / Request for Health Information Initial Comment Caller has body aches, sore throat, and headache. Caller may need a covid test. She has been vaccinated. Translation No Nurse Assessment Nurse: Saddie Benders, RN, Claudette Date/Time (Eastern Time): 10/06/2019 1:27:38 PM Confirm and document reason for call. If symptomatic, describe symptoms. ---Caller has body aches, sore throat, and headache. Caller may need a Covid-19 test. She has been vaccinated. 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 On Friday she states she got overheated. Has the patient had close contact with a person known or suspected to have the novel coronavirus illness OR traveled / lives in area with major community spread (including international travel) in the last 14 days from the onset of symptoms? * If Asymptomatic, screen for exposure and travel within the last 14 days. ---Yes Does the patient have any new or worsening symptoms? ---Yes Will a triage be completed? ---Yes Related visit to physician within the last 2 weeks? ---No Does the PT have any chronic conditions? (i.e. diabetes, asthma, this includes High risk factors for pregnancy, etc.) ---Yes List chronic conditions. ---DM, gastric bypass surgery Is this a behavioral health  or substance abuse call? ---No Guidelines Guideline Title Affirmed Question Affirmed Notes Nurse Date/Time (Eastern Time) COVID-19 - Diagnosed or Suspected [1] COVID-19 infection suspected by caller or triager AND [2] mild symptoms (cough, fever, Saddie Benders, RN, Claudette 10/06/2019 1:29:32 PM PLEASE NOTE: All timestamps contained within this report are represented as [place] Standard Time. CONFIDENTIALTY NOTICE: This fax transmission is intended only for the addressee. It contains information that is legally privileged, confidential or otherwise protected from use or disclosure. If you are not the intended recipient, you are strictly prohibited from reviewing, disclosing, copying using or disseminating any of this information or taking any action in reliance on or regarding this information. If you have received this fax in error, please notify us immediately by telephone so that we can arrange for its return to [place]. Phone: [phone], Toll-Free: [phone], Fax: [phone] Page: 2 of 2 Call Id: [id number] Guidelines Guideline Title Affirmed Question Affirmed Notes Nurse Date/Time [name] Time) or others) AND [3] no complications or SOB Disp. Time [name] Time) Disposition Final User 10/06/2019 1:36:13 PM Call PCP when Office is Open Yes Saddie Benders, RN, Claudette Caller Disagree/Comply Comply Caller Understands Yes PreDisposition Did not know what to do Care Advice Given Per Guideline CALL PCP WHEN OFFICE IS OPEN: * You need to discuss this with your doctor (or NP/PA) within the next few days. * Call the office when it is open. REASSURANCE AND EDUCATION - SUSPECTED COVID-19: * You suspect you have COVID-19 because you have symptoms that match and you were either exposed to someone with it or because it widespread in your community. HOW TO PROTECT OTHERS - WHEN YOU ARE SICK WITH COVID-19: * STAY HOME A MINIMUM OF 10 DAYS: Home isolation is  needed for at least 10 days after the symptoms  started. Stay home from school or work if you are sick. Do NOT go to religious services, child care centers, shopping, or other public places. Do NOT use public transportation (e.g., bus, taxis, ride-sharing). Do NOT allow any visitors to your home. Leave the house only if you need to seek urgent medical care. GENERAL CARE ADVICE FOR COVID-19 SYMPTOMS: * Feeling dehydrated: Drink extra liquids. If the air in your home is dry, use a humidifier. * Muscle aches, headache, and other pains: Often this comes and goes with the fever. Take acetaminophen every 4-6 hours (Adults 650 mg) OR ibuprofen every 6 to 8 hours (Adults 400 mg). Before taking any medicine, read all the instructions on the package. * Sore throat: Try throat lozenges, hard candy or warm chicken broth. HUMIDIFIER: * If the air is dry, use a humidifier in the bedroom. * Dry air makes coughs worse. CARE ADVICE given per COVID-19 - DIAGNOSED OR SUSPECTED (Adult) guideline. CALL BACK IF: * Fever over 103 F (39.4 C) * Fever lasts over 3 days * Fever returns after being gone for 24 hours *

## 2019-10-31 ENCOUNTER — Other Ambulatory Visit: Payer: Self-pay | Admitting: Family Medicine

## 2019-10-31 DIAGNOSIS — G2581 Restless legs syndrome: Secondary | ICD-10-CM

## 2019-11-22 ENCOUNTER — Other Ambulatory Visit: Payer: Self-pay

## 2019-11-22 ENCOUNTER — Ambulatory Visit: Payer: BC Managed Care – PPO | Admitting: Family Medicine

## 2019-11-22 ENCOUNTER — Encounter: Payer: Self-pay | Admitting: Family Medicine

## 2019-11-22 VITALS — BP 110/68 | HR 78 | Temp 98.1°F | Ht 63.0 in | Wt 177.0 lb

## 2019-11-22 DIAGNOSIS — R350 Frequency of micturition: Secondary | ICD-10-CM | POA: Diagnosis not present

## 2019-11-22 DIAGNOSIS — G2581 Restless legs syndrome: Secondary | ICD-10-CM | POA: Diagnosis not present

## 2019-11-22 DIAGNOSIS — L309 Dermatitis, unspecified: Secondary | ICD-10-CM | POA: Diagnosis not present

## 2019-11-22 DIAGNOSIS — Z23 Encounter for immunization: Secondary | ICD-10-CM | POA: Diagnosis not present

## 2019-11-22 DIAGNOSIS — Z1231 Encounter for screening mammogram for malignant neoplasm of breast: Secondary | ICD-10-CM

## 2019-11-22 DIAGNOSIS — R300 Dysuria: Secondary | ICD-10-CM | POA: Diagnosis not present

## 2019-11-22 LAB — POC URINALSYSI DIPSTICK (AUTOMATED)
Bilirubin, UA: NEGATIVE
Blood, UA: NEGATIVE
Glucose, UA: NEGATIVE
Ketones, UA: NEGATIVE
Leukocytes, UA: NEGATIVE
Nitrite, UA: NEGATIVE
Protein, UA: POSITIVE — AB
Spec Grav, UA: 1.025 (ref 1.010–1.025)
Urobilinogen, UA: NEGATIVE E.U./dL — AB
pH, UA: 6 (ref 5.0–8.0)

## 2019-11-22 MED ORDER — NITROFURANTOIN MONOHYD MACRO 100 MG PO CAPS: 100.0000 mg | ORAL_CAPSULE | Freq: Two times a day (BID) | ORAL | 0 refills | Status: AC

## 2019-11-22 MED ORDER — TRIAMCINOLONE ACETONIDE 0.1 % EX CREA: 1.0000 "application " | TOPICAL_CREAM | Freq: Two times a day (BID) | CUTANEOUS | 0 refills | Status: DC

## 2019-11-22 MED ORDER — PRAMIPEXOLE DIHYDROCHLORIDE 0.25 MG PO TABS: 0.2500 mg | ORAL_TABLET | Freq: Every evening | ORAL | 2 refills | Status: DC

## 2019-11-22 NOTE — Progress Notes (Signed)
Chief Complaint  Patient presents with   Dysuria   Urinary Frequency   Back Pain    Lisa [name] is a 59 y.o. female here for possible UTI.  Duration: 2 weeks. Symptoms: Dysuria, urinary frequency, urinary retention, incontinence, and urgency; he does have left-sided low back pain but not in the area of the flank Denies: hematuria, fever, nausea and vomiting, vaginal discharge, flank pain Hx of recurrent UTI? No Denies new sexual partners.  Pt w hx of RLS. Takes Mirapex 0.25 mg qhs. No AE's, works well. Compliant.    Patient notices a dark patch on her right upper back area that itches.  She has not tried anything at home.  No new topicals.  No drainage, pain, or scaling.  Past Medical History:  Diagnosis Date   Diabetes mellitus without complication (HCC)    type II   GERD (gastroesophageal reflux disease)    Hyperlipidemia    Insomnia    Restless leg      BP 110/68 (BP Location: Left Arm, Patient Position: Sitting, Cuff Size: Normal)    Pulse 78    Temp 98.1 F (36.7 C) (Oral)    Ht 5\' 3"  (1.6 m)    Wt 177 lb (80.3 kg)    SpO2 98%    BMI 31.35 kg/m  General: Awake, alert, appears stated age Heart: RRR Lungs: CTAB, normal respiratory effort, no accessory muscle usage Abd: BS+, soft, NT, ND, no masses or organomegaly MSK: No CVA tenderness, neg Lloyd's sign Skin: There is a hyperpigmented patch in the right upper shoulder area.  There is no scaling, erythema, tenderness to palpation, fluctuance, or drainage. Psych: Age appropriate judgment and insight  Dysuria - Plan: POCT Urinalysis Dipstick (Automated), Urine Culture, nitrofurantoin, macrocrystal-monohydrate, (MACROBID) 100 MG capsule  Frequent urination - Plan: POCT Urinalysis Dipstick (Automated), Urine Culture, nitrofurantoin, macrocrystal-monohydrate, (MACROBID) 100 MG capsule  RLS (restless legs syndrome) - Plan: pramipexole (MIRAPEX) 0.25 MG tablet  Dermatitis - Plan: triamcinolone cream (KENALOG)  0.1 %  Encounter for screening mammogram for malignant neoplasm of breast - Plan: MM DIGITAL SCREENING BILATERAL  Need for influenza vaccination - Plan: Flu Vaccine QUAD 36+ mos IM  1/2. Stay hydrated.  Given symptoms, will treat empirically with 5 days of Macrobid.  Seek immediate care if pt starts to develop fevers, new/worsening symptoms, uncontrollable N/V. 3.  Continue Mirapex 0.25 mg nightly. 4.  Trial twice daily Kenalog 0.1% topically for 2 to 3 weeks.  If no improvement, will consider biopsy versus referral. F/u in 6 months for a physical, she was also given gynecology contact information.  She follows with endocrinology for her diabetes. The patient voiced understanding and agreement to the plan.  [place], DO 11/22/19 4:45 PM

## 2019-11-22 NOTE — Patient Instructions (Addendum)
Stay hydrated.   Warning signs/symptoms: Uncontrollable nausea/vomiting, fevers, worsening symptoms despite treatment, confusion.  Give [place] around 2 business days to get culture back to you.  Try to avoid scented products.   Let me know if the skin issue doesn't get better.  Call Center for [facility] at [phone] for an appointment.  They are located at [address], [place], [place], [address] (right across the hall from our office).  Let us know if you need anything.

## 2019-11-23 LAB — URINE CULTURE
MICRO NUMBER:: 11077587
SPECIMEN QUALITY:: ADEQUATE

## 2020-01-09 ENCOUNTER — Other Ambulatory Visit: Payer: Self-pay | Admitting: Family Medicine

## 2020-01-09 DIAGNOSIS — Z1231 Encounter for screening mammogram for malignant neoplasm of breast: Secondary | ICD-10-CM

## 2020-03-04 ENCOUNTER — Telehealth: Payer: Self-pay | Admitting: Family Medicine

## 2020-03-04 DIAGNOSIS — Z1152 Encounter for screening for COVID-19: Secondary | ICD-10-CM

## 2020-03-04 NOTE — Telephone Encounter (Signed)
The patient requested an order to be faxed to [facility] to have a Covid test done. She has had mild symptoms for 3 days and exposed at school. She hoped that having an order faxed would help her to get the test done quicker. Informed the patient she would need to call the lab to determine their schedule. She verbalize understanding.

## 2020-03-04 NOTE — Telephone Encounter (Signed)
Patient is requesting an order place to [facility] today for a covid test    [facility] location [address] [place], [place] [address] [facility] [address] [place], [place] [address]  Phone: [phone] Fax: [phone] Hours: Mon-Fri 8:00A-5:00P Closed Lunch 1:00P-2:00P Drug Screens 8:30A-12:30P Drug Screens 2:30P-4:30P

## 2020-03-30 ENCOUNTER — Other Ambulatory Visit: Payer: Self-pay | Admitting: General Surgery

## 2020-03-30 ENCOUNTER — Other Ambulatory Visit (HOSPITAL_BASED_OUTPATIENT_CLINIC_OR_DEPARTMENT_OTHER): Payer: Self-pay | Admitting: General Surgery

## 2020-03-30 DIAGNOSIS — R110 Nausea: Secondary | ICD-10-CM

## 2020-03-30 DIAGNOSIS — Z9884 Bariatric surgery status: Secondary | ICD-10-CM

## 2020-03-30 DIAGNOSIS — R109 Unspecified abdominal pain: Secondary | ICD-10-CM

## 2020-03-31 ENCOUNTER — Other Ambulatory Visit: Payer: Self-pay

## 2020-03-31 ENCOUNTER — Ambulatory Visit (HOSPITAL_COMMUNITY)
Admission: RE | Admit: 2020-03-31 | Discharge: 2020-03-31 | Disposition: A | Discharge status: Home / Self Care | Payer: BC Managed Care – PPO | Source: Ambulatory Visit | Attending: General Surgery | Admitting: General Surgery

## 2020-03-31 DIAGNOSIS — R110 Nausea: Secondary | ICD-10-CM | POA: Diagnosis present

## 2020-03-31 DIAGNOSIS — R109 Unspecified abdominal pain: Secondary | ICD-10-CM | POA: Diagnosis present

## 2020-03-31 DIAGNOSIS — Z9884 Bariatric surgery status: Secondary | ICD-10-CM | POA: Diagnosis not present

## 2020-03-31 LAB — POCT I-STAT CREATININE: Creatinine, Ser: 0.5 mg/dL (ref 0.44–1.00)

## 2020-03-31 MED ORDER — IOHEXOL 9 MG/ML PO SOLN
1000.0000 mL | ORAL | Status: AC
  Administered 2020-03-31: 1000 mL via ORAL

## 2020-03-31 MED ORDER — IOHEXOL 300 MG/ML  SOLN
100.0000 mL | Freq: Once | INTRAMUSCULAR | Status: AC | PRN
  Administered 2020-03-31: 100 mL via INTRAVENOUS

## 2020-03-31 MED ORDER — IOHEXOL 9 MG/ML PO SOLN
ORAL | Status: AC
  Filled 2020-03-31: qty 1000

## 2020-06-09 ENCOUNTER — Other Ambulatory Visit: Payer: Self-pay

## 2020-06-09 ENCOUNTER — Telehealth: Payer: Self-pay

## 2020-06-09 ENCOUNTER — Telehealth: Payer: BC Managed Care – PPO | Admitting: Family Medicine

## 2020-06-09 NOTE — Telephone Encounter (Signed)
Needing to cancel tele health appt for today at 0745.  Appt cancelled.

## 2020-06-09 NOTE — Telephone Encounter (Signed)
Nurse Assessment Nurse: Lisa Leber, RN, [name] Date/Time (Eastern Time): 06/08/2020 9:56:44 AM Confirm and document reason for call. If symptomatic, describe symptoms. ---Caller states that she has been sick since Wednesday and running a fever of 100.3 to 103.5 orally. Is having chills, body aches. She states that she stops and catches her breathing but that is not unusual for her. Not eating much. Felt a little woosy when she gets up. Has been sleeping a lot. 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 Does the patient have any new or worsening symptoms? ---Yes Will a triage be completed? ---Yes Related visit to physician within the last 2 weeks? ---No Does the PT have any chronic conditions? (i.e. diabetes, asthma, this includes High risk factors for pregnancy, etc.) ---Yes List chronic conditions. ---diabetes in the past. Had gastric bypass and no more problems with diabetes. Last week had some hypoglycemia. Is this a behavioral health or substance abuse call? ---No Guidelines Guideline Title Affirmed Question Affirmed Notes Nurse Date/Time (Eastern Time) Weakness (Generalized) and Fatigue [1] MODERATE weakness (i.e., interferes with work, school, normal [name], Charity fundraiser, [name] 06/08/2020 10:01:44 AM PLEASE NOTE: All timestamps contained within this report are represented as [place] Standard Time. CONFIDENTIALTY NOTICE: This fax transmission is intended only for the addressee. It contains information that is legally privileged, confidential or otherwise protected from use or disclosure. If you are not the intended recipient, you are strictly prohibited from reviewing, disclosing, copying using or disseminating any of this information or taking any action in reliance on or regarding this information. If you have received this fax in error, please notify us immediately by telephone so that we can arrange for its return to [place]. Phone: [phone], Toll-Free:  [phone], Fax: [phone] Page: 2 of 2 Call Id: [id number] Guidelines Guideline Title Affirmed Question Affirmed Notes Nurse Date/Time [name] Time) activities) AND [2] persists > 3 days Disp. Time [name] Time) Disposition Final User 06/08/2020 10:09:56 AM See PCP within 24 Hours Yes Lisa Leber, RN, [name] Disagree/Comply Comply Caller Understands Yes PreDisposition Call Doctor Care Advice Given Per Guideline SEE PCP WITHIN 24 HOURS: Comments User: [name], RN Date/Time [name] Time): 06/08/2020 10:09:37 AM Warm transferred the caller to the office for an appointment today. User: [name], RN Date/Time [name] Time): 06/08/2020 10:10:49 AM Caller states that she has taken to COVID home tests which were negative. The last one she took was yesterday. Referrals Warm transfer to backline  Pt was scheduled w/ PCP this morning but called back to cancel, she was seen in ED yesterday.

## 2020-06-09 NOTE — Telephone Encounter (Signed)
Nurse Assessment Nurse: Alvester Morin RN, Marcelino Duster Date/Time (Eastern Time): 06/08/2020 6:24:29 PM Confirm and document reason for call. If symptomatic, describe symptoms. ---Caller states his wife has had Flu like symptoms. Caller states his wife has a fever on 104. Did contact office earlier and tried home care. Fever since last Wednesday, goes from 100-104. Goes from extreme cold and chills to extreme heat wave. In bed since Wednesday. Tried OTC meds. Had a virtual meeting with the doctor. Shivering over an hour and half today. Does the patient have any new or worsening symptoms? ---Yes Will a triage be completed? ---Yes Related visit to physician within the last 2 weeks? ---Yes Does the PT have any chronic conditions? (i.e. diabetes, asthma, this includes High risk factors for pregnancy, etc.) ---Yes List chronic conditions. ---Diabetes Is this a behavioral health or substance abuse call? ---No Guidelines Guideline Title Affirmed Question Affirmed Notes Nurse Date/Time (Eastern Time) Fever Difficult to awaken or acting confused (e.g., disoriented, slurred speech) Alvester Morin, RN, Marcelino Duster 06/08/2020 6:28:30 PM PLEASE NOTE: All timestamps contained within this report are represented as [place] Standard Time. CONFIDENTIALTY NOTICE: This fax transmission is intended only for the addressee. It contains information that is legally privileged, confidential or otherwise protected from use or disclosure. If you are not the intended recipient, you are strictly prohibited from reviewing, disclosing, copying using or disseminating any of this information or taking any action in reliance on or regarding this information. If you have received this fax in error, please notify us immediately by telephone so that we can arrange for its return to [place]. Phone: [phone], Toll-Free: [phone], Fax: [phone] Page: 2 of 2 Call Id: [id number] Disp. Time [name] Time) Disposition Final User 06/08/2020 6:23:23  PM Send to Urgent Elyse Hsu 06/08/2020 6:32:26 PM Call EMS 911 Now Yes Alvester Morin, RN, Lavon Paganini Disagree/Comply Disagree Caller Understands Yes PreDisposition Call Doctor Care Advice Given Per Guideline CALL EMS 911 NOW: * Immediate medical attention is needed. You need to hang up and call 911 (or an ambulance). * Triager Discretion: I'll call you back in a few minutes to be sure you were able to reach them. CARE ADVICE given per Fever (Adult) guideline. Comments User: Len Childs, RN Date/Time [name] Time): 06/08/2020 6:32:25 PM Spouse was already looking at [facility] to proceed to when we called patient back but she wanted reassurance to go. Spouse advised he is taking her vs EMS. Referrals GO TO FACILITY OTHER - SPECIFY   Pt seen in ED yesterday.

## 2020-06-10 NOTE — Progress Notes (Signed)
Pt went to ED last night, cancelling appt.

## 2020-07-15 ENCOUNTER — Encounter: Payer: Self-pay | Admitting: Family Medicine

## 2020-07-15 ENCOUNTER — Telehealth (INDEPENDENT_AMBULATORY_CARE_PROVIDER_SITE_OTHER): Payer: BC Managed Care – PPO | Admitting: Family Medicine

## 2020-07-15 DIAGNOSIS — U071 COVID-19: Secondary | ICD-10-CM | POA: Diagnosis not present

## 2020-07-15 MED ORDER — PREDNISONE 20 MG PO TABS: 40.0000 mg | ORAL_TABLET | Freq: Every day | ORAL | 0 refills | Status: AC

## 2020-07-15 MED ORDER — PROMETHAZINE-DM 6.25-15 MG/5ML PO SYRP: 5.0000 mL | ORAL_SOLUTION | Freq: Four times a day (QID) | ORAL | 0 refills | Status: DC | PRN

## 2020-07-15 NOTE — Progress Notes (Signed)
Chief Complaint  Patient presents with  . Covid Positive    Tested Positive for Covid 07/14/20.  Marland Kitchen Cough  . Sore Throat  . Generalized Body Aches    Lisa [name] here for URI complaints. Due to COVID-19 pandemic, we are interacting via web portal for an electronic face-to-face visit. I verified patient's ID using 2 identifiers. Patient agreed to proceed with visit via this method. Patient is at home, I am at office. Patient and I are present for visit.   Duration: 2 day  Associated symptoms: sinus congestion, itchy watery eyes, sore throat, chest tightness, myalgia and coughing, diarrhea Denies: sinus pain, rhinorrhea, ear pain, ear drainage, wheezing, shortness of breath and N/V, loss of smell/taste Treatment to date: Mucinex, heat, Tylenol Sick contacts: No  Tested + for covid at home on 6/7, going to get PCR for work.   Past Medical History:  Diagnosis Date  . Diabetes mellitus without complication (HCC)    type II  . GERD (gastroesophageal reflux disease)   . Hyperlipidemia   . Insomnia   . Restless leg    Objective No conversational dyspnea Age appropriate judgment and insight Nml affect and mood  COVID-19 - Plan: predniSONE (DELTASONE) 20 MG tablet, promethazine-dextromethorphan (PROMETHAZINE-DM) 6.25-15 MG/5ML syrup  5 d pred burst, cough syrup prn. Declined anti-viral for now, which I think is reasonable. Her DM is very well controlled. She will message if anything changes.  Continue to push fluids, practice good hand hygiene, cover mouth when coughing. F/u prn. If starting to experience fevers, shaking, or shortness of breath, seek immediate care. Pt voiced understanding and agreement to the plan.  Jilda Roche [place], DO 07/15/20 10:18 AM

## 2020-09-08 ENCOUNTER — Other Ambulatory Visit: Payer: Self-pay

## 2020-09-08 ENCOUNTER — Encounter: Payer: Self-pay | Admitting: Family Medicine

## 2020-09-08 ENCOUNTER — Ambulatory Visit (INDEPENDENT_AMBULATORY_CARE_PROVIDER_SITE_OTHER): Payer: BC Managed Care – PPO | Admitting: Family Medicine

## 2020-09-08 VITALS — BP 110/72 | HR 83 | Temp 98.1°F | Ht 63.0 in | Wt 178.1 lb

## 2020-09-08 DIAGNOSIS — E669 Obesity, unspecified: Secondary | ICD-10-CM | POA: Diagnosis not present

## 2020-09-08 DIAGNOSIS — E1169 Type 2 diabetes mellitus with other specified complication: Secondary | ICD-10-CM

## 2020-09-08 DIAGNOSIS — Z0000 Encounter for general adult medical examination without abnormal findings: Secondary | ICD-10-CM | POA: Diagnosis not present

## 2020-09-08 DIAGNOSIS — G2581 Restless legs syndrome: Secondary | ICD-10-CM

## 2020-09-08 DIAGNOSIS — Z1231 Encounter for screening mammogram for malignant neoplasm of breast: Secondary | ICD-10-CM | POA: Diagnosis not present

## 2020-09-08 MED ORDER — PRAMIPEXOLE DIHYDROCHLORIDE 0.25 MG PO TABS
0.2500 mg | ORAL_TABLET | Freq: Every evening | ORAL | 2 refills | Status: DC
Start: 2020-09-08 — End: 2021-06-29

## 2020-09-08 NOTE — Patient Instructions (Addendum)
Give [place] 2-3 business days to get the results of your labs back.   Keep the diet clean and stay active.  Aim to do some physical exertion for 150 minutes per week. This is typically divided into 5 days per week, 30 minutes per day. The activity should be enough to get your heart rate up. Anything is better than nothing if you have time constraints.  The new Shingrix vaccine (for shingles) is a 2 shot series. It can make people feel low energy, achy and almost like they have the flu for 48 hours after injection. Please plan accordingly when deciding on when to get this shot. Call our office for a nurse visit appointment to get this. The second shot of the series is less severe regarding the side effects, but it still lasts 48 hours.  I would consider getting the covid vaccine booster. Let me know if you have questions.    If you do not hear anything about your referral in the next 1-2 weeks, call our office and ask for an update.  Someone will reach out regarding your mammogram.   I recommend getting the flu shot in mid October. This suggestion would change if the CDC comes out with a different recommendation.   Let us know if you need anything.

## 2020-09-08 NOTE — Progress Notes (Signed)
Chief Complaint  Patient presents with   Annual Exam     Well Woman Lisa [name] is here for a complete physical.   Her last physical was >1 year ago.  Current diet: in general, a "pretty healthy" diet. Current exercise: walking. Weight is stable and she denies fatigue out of ordinary. Seatbelt? 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  DXE11.9 (Patient taking differently: Test three times daily to check blood sugar.  DXE11.9) 200 each 6   ONE TOUCH LANCETS MISC Test as directed three times daily to check blood sugar.  DXE11.9 (Patient taking differently: Test as directed three times daily to check blood sugar.  DXE11.9) 200 each 6   pramipexole (MIRAPEX) 0.25 MG tablet Take 1 tablet (0.25 mg total) by mouth at bedtime. 90 tablet 2   triamcinolone cream (KENALOG) 0.1 % Apply 1 application topically 2 (two) times daily. 30 g 0   Allergies No Known Allergies  Review of Systems: Constitutional:  no unexpected weight  changes Eye:  no recent significant change in vision Ear/Nose/Mouth/Throat:  Ears:  no recent change in hearing Nose/Mouth/Throat:  no complaints of nasal congestion, no sore throat Cardiovascular: no chest pain Respiratory:  no shortness of breath Gastrointestinal:  no abdominal pain, no change in bowel habits GU:  Female: negative for dysuria or pelvic pain Musculoskeletal/Extremities:  no pain of the joints Integumentary (Skin/Breast):  no abnormal skin lesions reported Neurologic:  no headaches Endocrine:  denies fatigue  Exam BP 110/72   Pulse 83   Temp 98.1 F (36.7 C) (Oral)   Ht 5\' 3"  (1.6 m)   Wt 178 lb 2 oz (80.8 kg)   SpO2 98%   BMI 31.55 kg/m  General:  well developed, well nourished, in no apparent distress Skin:  no significant moles, warts, or growths Head:  no masses, lesions, or tenderness Eyes:  pupils equal and round, sclera anicteric without injection Ears:  canals without lesions, TMs shiny without retraction, no obvious effusion, no erythema Nose:  nares patent, septum midline, mucosa normal, and no drainage or sinus tenderness Throat/Pharynx:  lips and gingiva without lesion; tongue and uvula midline; non-inflamed pharynx; no exudates or postnasal drainage Neck: neck supple without adenopathy, thyromegaly, or masses Lungs:  clear to auscultation, breath sounds equal bilaterally, no respiratory distress Cardio:  regular rate and rhythm, no LE edema Abdomen:  abdomen soft, nontender; bowel sounds normal; no masses or organomegaly Genital: Defer to GYN  Musculoskeletal:  symmetrical muscle groups noted without atrophy or deformity Extremities:  no clubbing, cyanosis, or edema, no deformities, no skin discoloration Neuro:  gait normal; deep tendon reflexes normal and symmetric; sensation intact to pinprick b/l feet Psych: well oriented with normal range of affect and appropriate judgment/insight  Assessment and Plan  Well adult exam  Diabetes mellitus  type 2 in obese (HCC) - Plan: Microalbumin / creatinine urine ratio, Ambulatory referral to Ophthalmology, CBC, Comprehensive metabolic panel, Hemoglobin A1c, Lipid panel  Encounter for screening mammogram for malignant neoplasm of breast - Plan: MM DIGITAL SCREENING BILATERAL  RLS (restless legs syndrome) - Plan: pramipexole (MIRAPEX) 0.25 MG tablet   Well 60 y.o. female. Counseled on diet and exercise. Wt bearing exercise rec'd. Discussed Shingrix, covid booster, flu shots. Mammogram reordered. Ophtho referral.  Discussed statin use for DM2.  Other orders as above. Follow up for fasting labs at convenience, med ck in 6 mo. The patient voiced understanding and agreement to the plan.  Jilda Roche [place], DO 09/08/20 2:33 PM

## 2020-09-09 ENCOUNTER — Other Ambulatory Visit (INDEPENDENT_AMBULATORY_CARE_PROVIDER_SITE_OTHER): Payer: BC Managed Care – PPO

## 2020-09-09 ENCOUNTER — Other Ambulatory Visit: Payer: Self-pay | Admitting: Family Medicine

## 2020-09-09 DIAGNOSIS — E1169 Type 2 diabetes mellitus with other specified complication: Secondary | ICD-10-CM | POA: Diagnosis not present

## 2020-09-09 DIAGNOSIS — E785 Hyperlipidemia, unspecified: Secondary | ICD-10-CM

## 2020-09-09 DIAGNOSIS — E669 Obesity, unspecified: Secondary | ICD-10-CM

## 2020-09-09 LAB — COMPREHENSIVE METABOLIC PANEL
ALT: 16 U/L (ref 0–35)
AST: 17 U/L (ref 0–37)
Albumin: 3.9 g/dL (ref 3.5–5.2)
Alkaline Phosphatase: 96 U/L (ref 39–117)
BUN: 7 mg/dL (ref 6–23)
CO2: 29 mEq/L (ref 19–32)
Calcium: 9.2 mg/dL (ref 8.4–10.5)
Chloride: 105 mEq/L (ref 96–112)
Creatinine, Ser: 0.55 mg/dL (ref 0.40–1.20)
GFR: 100.72 mL/min (ref >60.00)
Glucose, Bld: 104 mg/dL — ABNORMAL HIGH (ref 70–99)
Potassium: 3.5 mEq/L (ref 3.5–5.1)
Sodium: 142 mEq/L (ref 135–145)
Total Bilirubin: 0.8 mg/dL (ref 0.2–1.2)
Total Protein: 6.5 g/dL (ref 6.0–8.3)

## 2020-09-09 LAB — LIPID PANEL
Cholesterol: 220 mg/dL — ABNORMAL HIGH (ref 0–200)
HDL: 70.6 mg/dL (ref >39.00)
LDL Cholesterol: 132 mg/dL — ABNORMAL HIGH (ref 0–99)
NonHDL: 149.48
Total CHOL/HDL Ratio: 3
Triglycerides: 85 mg/dL (ref 0.0–149.0)
VLDL: 17 mg/dL (ref 0.0–40.0)

## 2020-09-09 LAB — CBC
HCT: 40.1 % (ref 36.0–46.0)
Hemoglobin: 13.6 g/dL (ref 12.0–15.0)
MCHC: 33.8 g/dL (ref 30.0–36.0)
MCV: 88.1 fl (ref 78.0–100.0)
Platelets: 294 10*3/uL (ref 150.0–400.0)
RBC: 4.55 Mil/uL (ref 3.87–5.11)
RDW: 13.8 % (ref 11.5–15.5)
WBC: 7.9 10*3/uL (ref 4.0–10.5)

## 2020-09-09 LAB — HEMOGLOBIN A1C: Hgb A1c MFr Bld: 5.8 % (ref 4.6–6.5)

## 2020-09-09 LAB — MICROALBUMIN / CREATININE URINE RATIO
Creatinine,U: 41.9 mg/dL
Microalb Creat Ratio: 1.7 mg/g (ref 0.0–30.0)
Microalb, Ur: <0.7 mg/dL (ref 0.0–1.9)

## 2020-09-09 MED ORDER — ROSUVASTATIN CALCIUM 10 MG PO TABS: 10.0000 mg | ORAL_TABLET | Freq: Every day | ORAL | 3 refills | Status: DC

## 2020-09-15 ENCOUNTER — Encounter (HOSPITAL_BASED_OUTPATIENT_CLINIC_OR_DEPARTMENT_OTHER): Payer: Self-pay

## 2020-09-15 ENCOUNTER — Ambulatory Visit (HOSPITAL_BASED_OUTPATIENT_CLINIC_OR_DEPARTMENT_OTHER)
Admission: RE | Admit: 2020-09-15 | Discharge: 2020-09-15 | Disposition: A | Discharge status: Home / Self Care | Payer: BC Managed Care – PPO | Source: Ambulatory Visit | Attending: Family Medicine | Admitting: Family Medicine

## 2020-09-15 ENCOUNTER — Other Ambulatory Visit: Payer: Self-pay | Admitting: Family Medicine

## 2020-09-15 ENCOUNTER — Other Ambulatory Visit: Payer: Self-pay

## 2020-09-15 DIAGNOSIS — Z1231 Encounter for screening mammogram for malignant neoplasm of breast: Secondary | ICD-10-CM | POA: Insufficient documentation

## 2021-01-06 ENCOUNTER — Ambulatory Visit: Payer: BC Managed Care – PPO | Admitting: Family Medicine

## 2021-01-06 ENCOUNTER — Encounter: Payer: Self-pay | Admitting: Family Medicine

## 2021-01-06 VITALS — BP 120/76 | HR 69 | Temp 98.0°F | Ht 63.0 in | Wt 182.2 lb

## 2021-01-06 DIAGNOSIS — R413 Other amnesia: Secondary | ICD-10-CM | POA: Diagnosis not present

## 2021-01-06 DIAGNOSIS — G2581 Restless legs syndrome: Secondary | ICD-10-CM | POA: Diagnosis not present

## 2021-01-06 DIAGNOSIS — R2689 Other abnormalities of gait and mobility: Secondary | ICD-10-CM

## 2021-01-06 DIAGNOSIS — G4700 Insomnia, unspecified: Secondary | ICD-10-CM

## 2021-01-06 DIAGNOSIS — N3091 Cystitis, unspecified with hematuria: Secondary | ICD-10-CM

## 2021-01-06 LAB — POC URINALSYSI DIPSTICK (AUTOMATED)
Bilirubin, UA: NEGATIVE
Glucose, UA: NEGATIVE
Ketones, UA: NEGATIVE
Nitrite, UA: NEGATIVE
Protein, UA: NEGATIVE
Spec Grav, UA: 1.01 (ref 1.010–1.025)
Urobilinogen, UA: 0.2 E.U./dL
pH, UA: 5 (ref 5.0–8.0)

## 2021-01-06 MED ORDER — SULFAMETHOXAZOLE-TRIMETHOPRIM 800-160 MG PO TABS
1.0000 | ORAL_TABLET | Freq: Two times a day (BID) | ORAL | 0 refills | Status: AC
Start: 2021-01-06 — End: 2021-01-09

## 2021-01-06 MED ORDER — GABAPENTIN 300 MG PO CAPS: 300.0000 mg | ORAL_CAPSULE | Freq: Every day | ORAL | 2 refills | Status: DC

## 2021-01-06 MED ORDER — FLUCONAZOLE 150 MG PO TABS: ORAL_TABLET | ORAL | 0 refills | Status: DC

## 2021-01-06 NOTE — Patient Instructions (Signed)
If you do not hear anything about your referral in the next 1-2 weeks, call our office and ask for an update.  Stay hydrated.   Warning signs/symptoms: Uncontrollable nausea/vomiting, fevers, worsening symptoms despite treatment, confusion.  Give [place] around 2 business days to get culture back to you.  Schedule your eye exam.   Let us know if you need anything.

## 2021-01-06 NOTE — Progress Notes (Signed)
Chief Complaint  Patient presents with   Urinary Tract Infection   Dysuria    Gidget Quizhpi is a 60 y.o. female here for possible UTI.  Duration: 1 week. Symptoms: Dysuria, urinary frequency, urinary hesitancy, and urinary retention Denies: hematuria, fever, nausea, vomiting, urinary incontinence, new flank pain, vaginal discharge Hx of recurrent UTI? No  Balance/memory The patient has had several years of worsening balance/memory.  Lately she feels like both of her feet will not pick up and she will end up stumbling and catching herself.  This will happen on an average of 3 times per week which has increased.  She denies any pain in her legs.  He has no vertigo.  Her memory is getting worse as well.  She has difficulty remembering things even if she writes them down.  She does have a history of an undiagnosed brain condition causing similar memory issues in her uncle and father.  There is never a firm diagnosis associated with that.  She does have lots of stress with her oldest son being deployed to [place] for 9 to 12 months.  She will not be able to be in contact with him easily.  Of note, she also describes seeing shadows in her periphery at night.  She does have diabetes and is due for an eye exam.  Patient is been having worsening insomnia and RLS symptoms over the past several weeks.  She has never taken gabapentin before.  She assumed stress is playing a role.  There is no pain.  Past Medical History:  Diagnosis Date   Diabetes mellitus without complication (HCC)    type II   GERD (gastroesophageal reflux disease)    Hyperlipidemia    Insomnia    Restless leg      BP 120/76   Pulse 69   Temp 98 F (36.7 C) (Oral)   Ht 5\' 3"  (1.6 m)   Wt 182 lb 4 oz (82.7 kg)   SpO2 96%   BMI 32.28 kg/m  General: Awake, alert, appears stated age Heart: RRR Lungs: CTAB, normal respiratory effort, no accessory muscle usage Abd: BS+, soft, NT, ND, no masses or organomegaly Neuro: DTRs  equal and symmetric throughout.  5/5 strength throughout.  Gait is normal. MSK: No CVA tenderness, neg Lloyd's sign Psych: Age appropriate judgment and insight  Cystitis with hematuria - Plan: sulfamethoxazole-trimethoprim (BACTRIM DS) 800-160 MG tablet, fluconazole (DIFLUCAN) 150 MG tablet, POCT Urinalysis Dipstick (Automated), Urine Culture  Balance problem - Plan: Ambulatory referral to Neurology  Memory changes - Plan: B12, Comprehensive metabolic panel, TSH, T4, free, CBC, Ambulatory referral to Neurology  RLS (restless legs syndrome)  Insomnia, unspecified type  3 days of Bactrim, Diflucan if needed.  Stay hydrated. Seek immediate care if pt starts to develop fevers, new/worsening symptoms, uncontrollable N/V. Chronic, uncontrolled.  Check above labs.  If normal, she will undergo a consultation to the neurology team.  I will personally hold off on any imaging until she is a value by them as they may order some themselves. Will trial gabapentin 300 mg nightly for both insomnia and RLS. F/u in 1 month to recheck RLS. The patient voiced understanding and agreement to the plan.  [place], DO 01/06/21 2:05 PM

## 2021-01-07 LAB — CBC
HCT: 42.2 % (ref 36.0–46.0)
Hemoglobin: 14.1 g/dL (ref 12.0–15.0)
MCHC: 33.4 g/dL (ref 30.0–36.0)
MCV: 88.1 fl (ref 78.0–100.0)
Platelets: 282 10*3/uL (ref 150.0–400.0)
RBC: 4.8 Mil/uL (ref 3.87–5.11)
RDW: 13.6 % (ref 11.5–15.5)
WBC: 6.4 10*3/uL (ref 4.0–10.5)

## 2021-01-07 LAB — TSH: TSH: 1.4 u[IU]/mL (ref 0.35–5.50)

## 2021-01-07 LAB — COMPREHENSIVE METABOLIC PANEL
ALT: 25 U/L (ref 0–35)
AST: 22 U/L (ref 0–37)
Albumin: 4.4 g/dL (ref 3.5–5.2)
Alkaline Phosphatase: 105 U/L (ref 39–117)
BUN: 9 mg/dL (ref 6–23)
CO2: 30 mEq/L (ref 19–32)
Calcium: 9.8 mg/dL (ref 8.4–10.5)
Chloride: 103 mEq/L (ref 96–112)
Creatinine, Ser: 0.59 mg/dL (ref 0.40–1.20)
GFR: 98.8 mL/min (ref >60.00)
Glucose, Bld: 86 mg/dL (ref 70–99)
Potassium: 3.9 mEq/L (ref 3.5–5.1)
Sodium: 140 mEq/L (ref 135–145)
Total Bilirubin: 0.8 mg/dL (ref 0.2–1.2)
Total Protein: 7 g/dL (ref 6.0–8.3)

## 2021-01-07 LAB — T4, FREE: Free T4: 0.88 ng/dL (ref 0.60–1.60)

## 2021-01-07 LAB — VITAMIN B12: Vitamin B-12: 263 pg/mL (ref 211–911)

## 2021-01-08 LAB — URINE CULTURE
MICRO NUMBER:: 12696114
SPECIMEN QUALITY:: ADEQUATE

## 2021-02-09 ENCOUNTER — Encounter: Payer: Self-pay | Admitting: Family Medicine

## 2021-02-09 ENCOUNTER — Ambulatory Visit: Payer: BC Managed Care – PPO | Admitting: Family Medicine

## 2021-02-09 VITALS — BP 106/64 | HR 78 | Temp 98.5°F | Ht 63.0 in | Wt 181.5 lb

## 2021-02-09 DIAGNOSIS — F5104 Psychophysiologic insomnia: Secondary | ICD-10-CM | POA: Diagnosis not present

## 2021-02-09 DIAGNOSIS — Z23 Encounter for immunization: Secondary | ICD-10-CM

## 2021-02-09 DIAGNOSIS — R2689 Other abnormalities of gait and mobility: Secondary | ICD-10-CM

## 2021-02-09 DIAGNOSIS — G2581 Restless legs syndrome: Secondary | ICD-10-CM | POA: Diagnosis not present

## 2021-02-09 DIAGNOSIS — N390 Urinary tract infection, site not specified: Secondary | ICD-10-CM | POA: Diagnosis not present

## 2021-02-09 MED ORDER — PREGABALIN 100 MG PO CAPS: 100.0000 mg | ORAL_CAPSULE | Freq: Every day | ORAL | 2 refills | Status: DC

## 2021-02-09 NOTE — Progress Notes (Signed)
Chief Complaint  Patient presents with   Follow-up    UTI is better Mobility is still a problem.  Still stumbling    Subjective: Patient is a 61 y.o. female here for f/u.  RLS-patient was started on gabapentin 3 mg nightly for RLS and hopefully help with insomnia.  It did not help with insomnia at all.  It helped for a few days with her RLS symptoms.  At that stopped working.  She tried taking 600 mg nightly and noticed no improvement.  She has now failed Mirapex, Requip, and gabapentin.  She has an appointment with the neurology team next month.  She is stumbling particularly when she gets poor sleep.  She has been on Christmas break which is allowed her to get more sleep and her balance has been better during this time.  She had a UTI that extended to the kidneys in May 2022.  She had a UTI recently as well.  She has a history of recurrent UTIs where she was on a daily prophylactic antibiotic.  She is wondering if or when she needs to do this again.  Past Medical History:  Diagnosis Date   Diabetes mellitus without complication (HCC)    type II   GERD (gastroesophageal reflux disease)    Hyperlipidemia    Insomnia    Restless leg     Objective: BP 106/64    Pulse 78    Temp 98.5 F (36.9 C) (Oral)    Ht 5\' 3"  (1.6 m)    Wt 181 lb 8 oz (82.3 kg)    SpO2 97%    BMI 32.15 kg/m  General: Awake, appears stated age Heart: RRR, no LE edema Lungs: CTAB, no rales, wheezes or rhonchi. No accessory muscle use Neuro: DTRs equal and symmetric throughout, no clonus, no cerebellar signs.  5/5 strength throughout, gait is normal today. Psych: Age appropriate judgment and insight, normal affect and mood  Assessment and Plan: RLS (restless legs syndrome) - Plan: pregabalin (LYRICA) 100 MG capsule  Psychophysiological insomnia - Plan: pregabalin (LYRICA) 100 MG capsule  Balance problem  Recurrent UTI  Need for influenza vaccination - Plan: Flu Vaccine QUAD 6+ mos PF IM (Fluarix Quad  PF)  Need for vaccination against Streptococcus pneumoniae - Plan: Pneumococcal conjugate vaccine 20-valent (Prevnar 20)  Chronic, uncontrolled.  Stop gabapentin, start Lyrica 100 mg nightly.  Hopefully this helps with RLS symptoms in addition to insomnia.  She will follow-up with the neurology team next mo.  Hopefully Lyrica will help but if it does not, we will trial Belsomra.  She will send a message in 2 weeks if this is the case.  I did give her contact information for the counseling team should she decide on cognitive behavioral therapy. She has a follow-up with neurology but I do think that sleep is playing a role. She has another infection before May, would consider topical estrogen versus antibiotic prophylaxis.  She has seen urology in the past and would prefer to avoid seeing them again.  Anatomical issues have been ruled out. Flu shot today. PCV 20 today. The patient voiced understanding and agreement to the plan.  I spent 40 minutes with the patient discussing the above plan and reviewing her chart and examining the visit.  [place], DO 02/09/21  4:40 PM

## 2021-02-09 NOTE — Patient Instructions (Addendum)
End me a message in 2 weeks if you aren't doing well with the Lyrica for sleep.   The RLS follow up will lie with the neurology team.   Let us know if you need anything.  Sleep is important to [place] all. Getting good sleep is imperative to adequate functioning during the day. Work with our counselors who are trained to help people obtain quality sleep. Call [phone] to schedule an appointment or if you are curious about insurance coverage/cost.

## 2021-03-18 ENCOUNTER — Encounter (HOSPITAL_COMMUNITY): Payer: Self-pay | Admitting: *Deleted

## 2021-03-23 ENCOUNTER — Ambulatory Visit: Payer: BC Managed Care – PPO | Admitting: Diagnostic Neuroimaging

## 2021-03-23 ENCOUNTER — Encounter: Payer: Self-pay | Admitting: Diagnostic Neuroimaging

## 2021-03-23 VITALS — BP 134/78 | HR 87 | Ht 63.0 in | Wt 184.0 lb

## 2021-03-23 DIAGNOSIS — R413 Other amnesia: Secondary | ICD-10-CM | POA: Diagnosis not present

## 2021-03-23 DIAGNOSIS — R269 Unspecified abnormalities of gait and mobility: Secondary | ICD-10-CM

## 2021-03-23 NOTE — Progress Notes (Signed)
[facility]  PATIENT: Lisa [name] DOB: 09/01/1960  REFERRING CLINICIAN: Shelda Pal* HISTORY FROM: patient  REASON FOR VISIT: new consult    HISTORICAL  CHIEF COMPLAINT:  Chief Complaint  Patient presents with   New Patient (Initial Visit)    Rm 6 alone here for consult on worsening balance/memory. Pt reports trouble with balance has noticed her right foot will drag and will cause her to stumble. She reports over the last few months short term memory and seeing shadows at night.     HISTORY OF PRESENT ILLNESS:   61 year old female here for evaluation of memory problems gait difficulty.  For past 6 to 12 months patient having intermittent short-term memory problems, focus and attention difficulties.  Sometimes when she wakes up in the middle the night to go to the bathroom she sees shadows.  This does not happen during the daytime or when the lights are fully turned on.  Patient has family history of dementia with Lewy body in her father, paternal uncle and paternal grandfather.    Patient has history of diabetes with hemoglobin A1c of 12.5, now status post gastric bypass surgery 2 years ago with 80 pound weight loss and normalization of blood sugars.  Having chronic issues with insomnia and anxiety.  She drinks up to 1 gallon of 0-calorie iced tea per day.  Now taking CBD and over-the-counter sleep aids to help sleep.     REVIEW OF SYSTEMS: Full 14 system review of systems performed and negative with exception of: as per HPI.  ALLERGIES: No Known Allergies  HOME MEDICATIONS: Outpatient Medications Prior to Visit  Medication Sig Dispense Refill   pramipexole (MIRAPEX) 0.25 MG tablet Take 1 tablet (0.25 mg total) by mouth at bedtime. 90 tablet 2   fluconazole (DIFLUCAN) 150 MG tablet Take 1 tab, repeat in 72 hours if no improvement. (Patient not taking: Reported on 03/23/2021) 2 tablet 0   glucose blood (ONETOUCH VERIO) test strip Test  three times daily to check blood sugar.  DXE11.9 (Patient not taking: Reported on 03/23/2021) 200 each 6   ONE TOUCH LANCETS MISC Test as directed three times daily to check blood sugar.  DXE11.9 (Patient not taking: Reported on 03/23/2021) 200 each 6   pregabalin (LYRICA) 100 MG capsule Take 1 capsule (100 mg total) by mouth at bedtime. (Patient not taking: Reported on 03/23/2021) 30 capsule 2   rosuvastatin (CRESTOR) 10 MG tablet Take 1 tablet (10 mg total) by mouth daily. (Patient not taking: Reported on 03/23/2021) 30 tablet 3   triamcinolone cream (KENALOG) 0.1 % Apply 1 application topically 2 (two) times daily. (Patient not taking: Reported on 03/23/2021) 30 g 0   No facility-administered medications prior to visit.    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   Procedure: LAPAROSCOPIC ROUX-EN-Y GASTRIC BYPASS WITH UPPER ENDOSCOPY, LYSIS OF ADHESIONS X1HOUR, ERAS PATHWAY;  Surgeon: [name], MD;  Location: [facility];  Service: General;  Laterality: N/A;   OTHER SURGICAL HISTORY  2004   "bowel  obstruction-- open wound"   TUBAL LIGATION  1991    FAMILY HISTORY: Family History  Problem Relation Age of Onset   Diabetes Mother        type II   Hyperthyroidism Mother    Diabetes Father        type II   Dementia Father    Diabetes Paternal Grandmother    Dementia Paternal Grandfather     SOCIAL HISTORY: Social History   Socioeconomic History   Marital status: Married    Spouse name: Not on file   Number of children: Not on file   Years of education: Not on file   Highest  education level: Not on file  Occupational History   Not on file  Tobacco Use   Smoking status: Never   Smokeless tobacco: Never  Vaping Use   Vaping Use: Never used  Substance and Sexual Activity   Alcohol use: Yes    Comment: special occasions "couple times a year"   Drug use: No   Sexual activity: Yes  Other Topics Concern   Not on file  Social History Narrative   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 [place], grew up in [place]   Married   [place] children (2 sons one in [place] he has 2 sons and a daughter, another son in [place] and has one child)   Social Determinants of Radio broadcast assistant Strain: Not on file  Food Insecurity: Not on file  Transportation Needs: Not on file  Physical Activity: Not on file  Stress: Not on file  Social Connections: Not on file  Intimate Partner Violence: Not on file     PHYSICAL EXAM  GENERAL EXAM/CONSTITUTIONAL: Vitals:  Vitals:   03/23/21 1540  BP: 134/78  Pulse: 87  Weight: 184 lb (83.5 kg)  Height: 5\' 3"  (1.6 m)   Body mass index is 32.59 kg/m. Wt Readings from Last 3 Encounters:  03/23/21 184 lb (83.5 kg)  02/09/21 181 lb 8 oz (82.3 kg)  01/06/21 182 lb 4 oz (82.7 kg)   Patient is in no distress; well developed, nourished and groomed; neck is supple  CARDIOVASCULAR: Examination of carotid arteries is normal; no carotid bruits Regular rate and rhythm, no murmurs Examination of peripheral vascular system by observation and palpation is normal  EYES: Ophthalmoscopic exam of optic discs and posterior segments is normal; no papilledema or hemorrhages No results found.  MUSCULOSKELETAL: Gait, strength, tone, movements noted in Neurologic exam below  NEUROLOGIC: MENTAL STATUS:  No flowsheet data found. awake, alert, oriented to person, place and time recent and remote memory intact normal attention and concentration language fluent, comprehension intact, naming intact fund of  knowledge appropriate  CRANIAL NERVE:  2nd - no papilledema on fundoscopic exam 2nd, 3rd, 4th, 6th - pupils equal and reactive to light, visual fields full to confrontation, extraocular muscles intact, no nystagmus 5th - facial sensation symmetric 7th - facial strength symmetric 8th - hearing intact 9th - palate elevates symmetrically, uvula midline 11th - shoulder shrug symmetric 12th - tongue protrusion midline  MOTOR:  normal bulk and tone, full strength in the BUE, BLE  SENSORY:  normal and symmetric to light touch, temperature, vibration  COORDINATION:  finger-nose-finger, fine finger movements normal  REFLEXES:  deep tendon reflexes present and symmetric  GAIT/STATION:  narrow based gait    DIAGNOSTIC DATA (LABS, IMAGING, TESTING) - I reviewed patient records, labs, notes, testing and imaging myself where available.  Lab Results  Component Value Date   WBC 6.4 01/06/2021   HGB 14.1 01/06/2021   HCT 42.2 01/06/2021   MCV 88.1 01/06/2021   PLT 282.0 01/06/2021      Component Value Date/Time   NA 140 01/06/2021 1319   K 3.9 01/06/2021 1319   CL 103 01/06/2021 1319   CO2 30 01/06/2021 1319   GLUCOSE 86 01/06/2021 1319   BUN 9 01/06/2021 1319   CREATININE 0.59 01/06/2021 1319   CALCIUM 9.8 01/06/2021 1319   PROT 7.0 01/06/2021 1319   ALBUMIN 4.4 01/06/2021 1319   AST 22 01/06/2021 1319   ALT 25 01/06/2021 1319   ALKPHOS 105 01/06/2021 1319   BILITOT 0.8 01/06/2021 1319   GFRNONAA >60 08/22/2018 0410   GFRAA >60 08/22/2018 0410   Lab Results  Component Value Date   CHOL 220 (H) 09/09/2020   HDL 70.60 09/09/2020   LDLCALC 132 (H) 09/09/2020   TRIG 85.0 09/09/2020   CHOLHDL 3 09/09/2020   Lab Results  Component Value Date   HGBA1C 5.8 09/09/2020   Lab Results  Component Value Date   VITAMINB12 263 01/06/2021   Lab Results  Component Value Date   TSH 1.40 01/06/2021        ASSESSMENT AND PLAN  61 y.o. year old female here  with:   Dx:  1. Gait difficulty   2. Memory loss     PLAN:  MEMORY DIFFICULTY / BALANCE DIFFICULTY (6-12 months; could be related to chronic insomnia, anxiety, prior diabetes) - check MRI brain - safety / supervision issues reviewed - daily physical activity / exercise (at least 15-30 minutes) - eat more plants / vegetables - increase social activities, brain stimulation, games, puzzles, hobbies, crafts, arts, music - aim for at least 7-8 hours sleep per night (or more) --> sleep hygiene reviewed - avoid smoking and alcohol - caregiver resources provided - caution with medications, finances, driving - restart [id number] replacement  Return for pending if symptoms worsen or fail to improve, pending test results.    Penni Bombard, MD [id number], [id number] PM Certified in Neurology, Neurophysiology and Neuroimaging  [facility] [address] [place], [place] [address] [phone]

## 2021-03-23 NOTE — Patient Instructions (Addendum)
°  MEMORY DIFFICULTY / BALANCE DIFFICULTY - check MRI brain - safety / supervision issues reviewed - daily physical activity / exercise (at least 15-30 minutes) - eat more plants / vegetables - increase social activities, brain stimulation, games, puzzles, hobbies, crafts, arts, music - aim for at least 7-8 hours sleep per night (or more) --> sleep hygiene reviewed - avoid smoking and alcohol - caregiver resources provided - caution with medications, finances, driving - restart U93 replacement

## 2021-03-29 ENCOUNTER — Ambulatory Visit: Payer: BC Managed Care – PPO | Admitting: Family Medicine

## 2021-04-03 ENCOUNTER — Other Ambulatory Visit: Payer: Self-pay | Admitting: Family Medicine

## 2021-05-03 ENCOUNTER — Other Ambulatory Visit: Payer: Self-pay | Admitting: Family Medicine

## 2021-05-10 ENCOUNTER — Ambulatory Visit: Payer: BC Managed Care – PPO | Admitting: Family Medicine

## 2021-05-10 ENCOUNTER — Encounter: Payer: Self-pay | Admitting: Family Medicine

## 2021-05-10 VITALS — BP 110/72 | HR 72 | Temp 96.3°F | Ht 63.0 in | Wt 181.2 lb

## 2021-05-10 DIAGNOSIS — M25512 Pain in left shoulder: Secondary | ICD-10-CM | POA: Diagnosis not present
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
## 2021-05-10 NOTE — Patient Instructions (Signed)
It was a pleasure to see you today!  Ricquel Foulk was seen for left shoulder pain today [facility].  ?You can take ibuprofen 200 to 400 mg every 4 hours as needed with food.  If you develop any stomach issues, you may take OTC famotidine (Pepcid) or omeprazole (Prilosec)/esomeprazole (Nexium).  If you red stools or dark tarry stools, stop taking ibuprofen and come back to the clinic or go to the emergency room ? ?Best,  ?Thomes Dinning, MD, MS4/04/2021 11:51 AM  ?

## 2021-05-11 ENCOUNTER — Ambulatory Visit: Payer: BC Managed Care – PPO | Admitting: Family Medicine

## 2021-06-29 ENCOUNTER — Other Ambulatory Visit: Payer: Self-pay | Admitting: Family Medicine

## 2021-06-29 DIAGNOSIS — G2581 Restless legs syndrome: Secondary | ICD-10-CM

## 2021-07-23 ENCOUNTER — Encounter: Payer: Self-pay | Admitting: Family Medicine

## 2021-07-23 ENCOUNTER — Ambulatory Visit: Payer: BC Managed Care – PPO | Admitting: Family Medicine

## 2021-07-23 VITALS — BP 108/78 | HR 83 | Temp 98.0°F | Ht 63.0 in | Wt 183.0 lb

## 2021-07-23 DIAGNOSIS — M79642 Pain in left hand: Secondary | ICD-10-CM

## 2021-07-23 DIAGNOSIS — Z111 Encounter for screening for respiratory tuberculosis: Secondary | ICD-10-CM | POA: Diagnosis not present

## 2021-07-23 NOTE — Patient Instructions (Signed)
Let me know if you need any refills sent to [place].   Consider buddy taping the index finger to the middle finger.   Ice/cold pack over area for 10-15 min twice daily.  OK to take Tylenol 1000 mg (2 extra strength tabs) or 975 mg (3 regular strength tabs) every 6 hours as needed.  Good luck in the future!  Hand Exercises Hand exercises can be helpful for almost anyone. These exercises can strengthen the hands, improve flexibility and movement, and increase blood flow to the hands. These results can make work and daily tasks easier. Hand exercises can be especially helpful for people who have joint pain from arthritis or have nerve damage from overuse (carpal tunnel syndrome). These exercises can also help people who have injured a hand. Exercises Most of these hand exercises are gentle stretching and motion exercises. It is usually safe to do them often throughout the day. Warming up your hands before exercise may help to reduce stiffness. You can do this with gentle massage or by placing your hands in warm water for 10-15 minutes. It is normal to feel some stretching, pulling, tightness, or mild discomfort as you begin new exercises. This will gradually improve. Stop an exercise right away if you feel sudden, severe pain or your pain gets worse. Ask your health care provider which exercises are best for you. Knuckle bend or "claw" fist Stand or sit with your arm, hand, and all five fingers pointed straight up. Make sure to keep your wrist straight during the exercise. Gently bend your fingers down toward your palm until the tips of your fingers are touching the top of your palm. Keep your big knuckle straight and just bend the small knuckles in your fingers. Hold this position for 3 seconds. Straighten (extend) your fingers back to the starting position. Repeat this exercise 5-10 times with each hand. Full finger fist Stand or sit with your arm, hand, and all five fingers pointed straight up. Make  sure to keep your wrist straight during the exercise. Gently bend your fingers into your palm until the tips of your fingers are touching the middle of your palm. Hold this position for 3 seconds. Extend your fingers back to the starting position, stretching every joint fully. Repeat this exercise 5-10 times with each hand. Straight fist Stand or sit with your arm, hand, and all five fingers pointed straight up. Make sure to keep your wrist straight during the exercise. Gently bend your fingers at the big knuckle, where your fingers meet your hand, and the middle knuckle. Keep the knuckle at the tips of your fingers straight and try to touch the bottom of your palm. Hold this position for 3 seconds. Extend your fingers back to the starting position, stretching every joint fully. Repeat this exercise 5-10 times with each hand. Tabletop Stand or sit with your arm, hand, and all five fingers pointed straight up. Make sure to keep your wrist straight during the exercise. Gently bend your fingers at the big knuckle, where your fingers meet your hand, as far down as you can while keeping the small knuckles in your fingers straight. Think of forming a tabletop with your fingers. Hold this position for 3 seconds. Extend your fingers back to the starting position, stretching every joint fully. Repeat this exercise 5-10 times with each hand. Finger spread Place your hand flat on a table with your palm facing down. Make sure your wrist stays straight as you do this exercise. Spread your fingers  and thumb apart from each other as far as you can until you feel a gentle stretch. Hold this position for 3 seconds. Bring your fingers and thumb tight together again. Hold this position for 3 seconds. Repeat this exercise 5-10 times with each hand. Making circles Stand or sit with your arm, hand, and all five fingers pointed straight up. Make sure to keep your wrist straight during the exercise. Make a circle by  touching the tip of your thumb to the tip of your index finger. Hold for 3 seconds. Then open your hand wide. Repeat this motion with your thumb and each finger on your hand. Repeat this exercise 5-10 times with each hand. Thumb motion Sit with your forearm resting on a table and your wrist straight. Your thumb should be facing up toward the ceiling. Keep your fingers relaxed as you move your thumb. Lift your thumb up as high as you can toward the ceiling. Hold for 3 seconds. Bend your thumb across your palm as far as you can, reaching the tip of your thumb for the small finger (pinkie) side of your palm. Hold for 3 seconds. Repeat this exercise 5-10 times with each hand. Grip strengthening  Hold a stress ball or other soft ball in the middle of your hand. Slowly increase the pressure, squeezing the ball as much as you can without causing pain. Think of bringing the tips of your fingers into the middle of your palm. All of your finger joints should bend when doing this exercise. Hold your squeeze for 3 seconds, then relax. Repeat this exercise 5-10 times with each hand. Contact a health care provider if: Your hand pain or discomfort gets much worse when you do an exercise. Your hand pain or discomfort does not improve within 2 hours after you exercise. If you have any of these problems, stop doing these exercises right away. Do not do them again unless your health care provider says that you can. Get help right away if: You develop sudden, severe hand pain or swelling. If this happens, stop doing these exercises right away. Do not do them again unless your health care provider says that you can. Make sure you discuss any questions you have with your health care provider. Document Revised: 05/17/2018 Document Reviewed: 01/25/2018 Elsevier Patient Education  2020 [employer].

## 2021-07-23 NOTE — Progress Notes (Signed)
Musculoskeletal Exam  Patient: Lisa [name] DOB: Oct 27, 1960  DOS: 07/23/2021  SUBJECTIVE:  Chief Complaint:   Chief Complaint  Patient presents with   TB test    Hand Pain    Left     Lisa [name] is a 61 y.o.  female for evaluation and treatment of L hand pain.   Onset:  2 months ago. No inj or change in activity.  Location: base of index finger Character:  aching and shooting  Progression of issue:  is unchanged Associated symptoms: swelling No redness, bruising Treatment: to date has been ice and massage.   Neurovascular symptoms: no  Past Medical History:  Diagnosis Date   Diabetes mellitus without complication (HCC)    type II   GERD (gastroesophageal reflux disease)    Hyperlipidemia    Insomnia    Restless leg     Objective: VITAL SIGNS: BP 108/78   Pulse 83   Temp 98 F (36.7 C) (Oral)   Ht 5\' 3"  (1.6 m)   Wt 183 lb (83 kg)   SpO2 99%   BMI 32.42 kg/m  Constitutional: Well formed, well developed. No acute distress. Heart: Brisk capillary refill in the digits of the hand Thorax & Lungs: No accessory muscle use Musculoskeletal: L hand.   Normal active range of motion: yes.   Normal passive range of motion: yes Tenderness to palpation: yes, over 2nd MTP Deformity: no Ecchymosis: no Neurologic: Normal sensory function.  Psychiatric: Normal mood. Age appropriate judgment and insight. Alert & oriented x 3.    Assessment:  Hand pain, left  Screening-pulmonary TB - Plan: PPD  Plan: Stretches/exercises, heat, ice, Tylenol.  Could take several months to fully heal.  Would consider occupational therapy versus possible injection if no improvement. TB skin test today, will return on Monday morning to get it read. [place] at [place] end of [place], will not see her again.  She will let [place] know if she needs any refills sent to [place] while she waits to set up with a new primary care physician. The patient voiced understanding and  agreement to the plan.   [place], DO 07/23/21  1:54 PM

## 2021-07-26 ENCOUNTER — Ambulatory Visit (INDEPENDENT_AMBULATORY_CARE_PROVIDER_SITE_OTHER): Payer: BC Managed Care – PPO

## 2021-07-26 DIAGNOSIS — Z111 Encounter for screening for respiratory tuberculosis: Secondary | ICD-10-CM | POA: Diagnosis not present

## 2021-07-26 LAB — TB SKIN TEST
Induration: 0 mm
TB Skin Test: NEGATIVE

## 2021-07-26 NOTE — Progress Notes (Signed)
Pt here for PPD reading   Negative reading for PPD

## 2022-03-11 ENCOUNTER — Encounter (HOSPITAL_COMMUNITY): Payer: Self-pay | Admitting: *Deleted

## 2023-01-17 IMAGING — MG MM DIGITAL SCREENING BILAT W/ TOMO AND CAD
8 series · 8 of 24 positions shown · non-contrast
Comparison: Previous exam(s).

CLINICAL DATA: Screening.

EXAM:
DIGITAL SCREENING BILATERAL MAMMOGRAM WITH TOMOSYNTHESIS AND CAD
TECHNIQUE: Bilateral screening digital craniocaudal and mediolateral oblique
mammograms were obtained. Bilateral screening digital breast
tomosynthesis was performed. The images were evaluated with
computer-aided detection.

[R CC synth-2D]
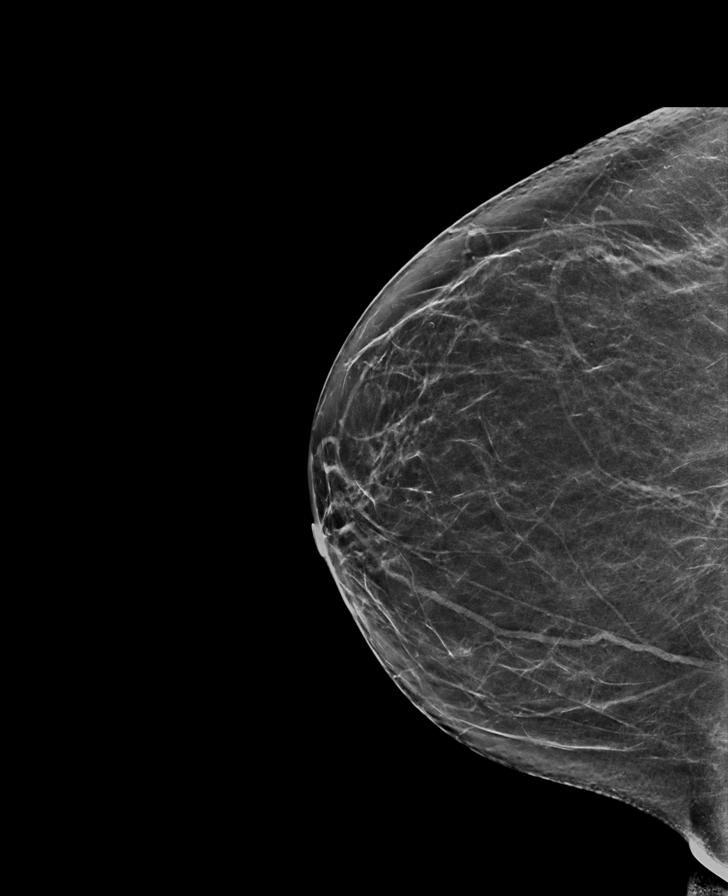

[L MLO synth-2D]
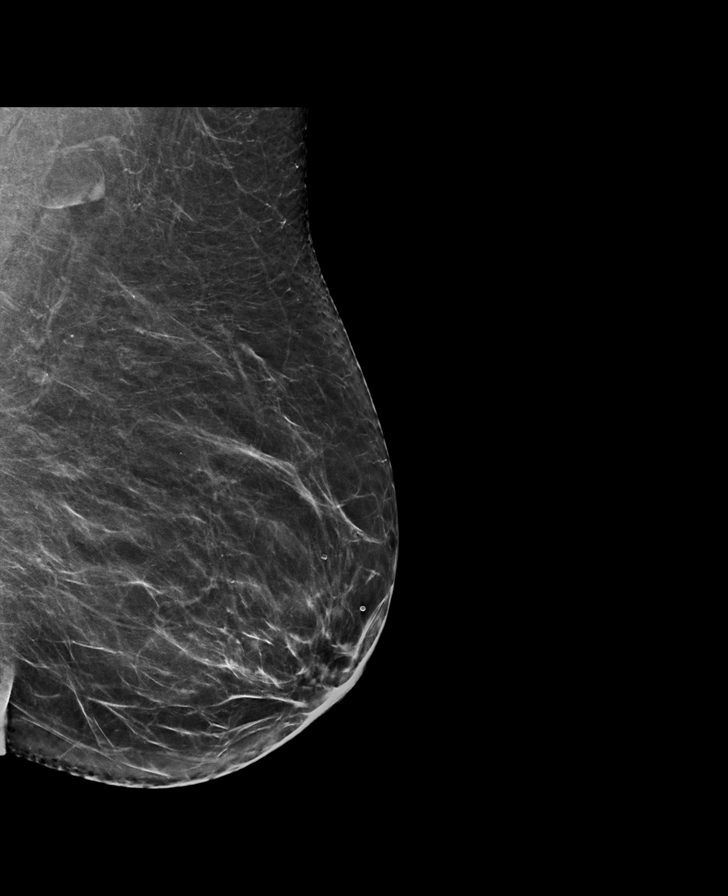

[L CC synth-2D]
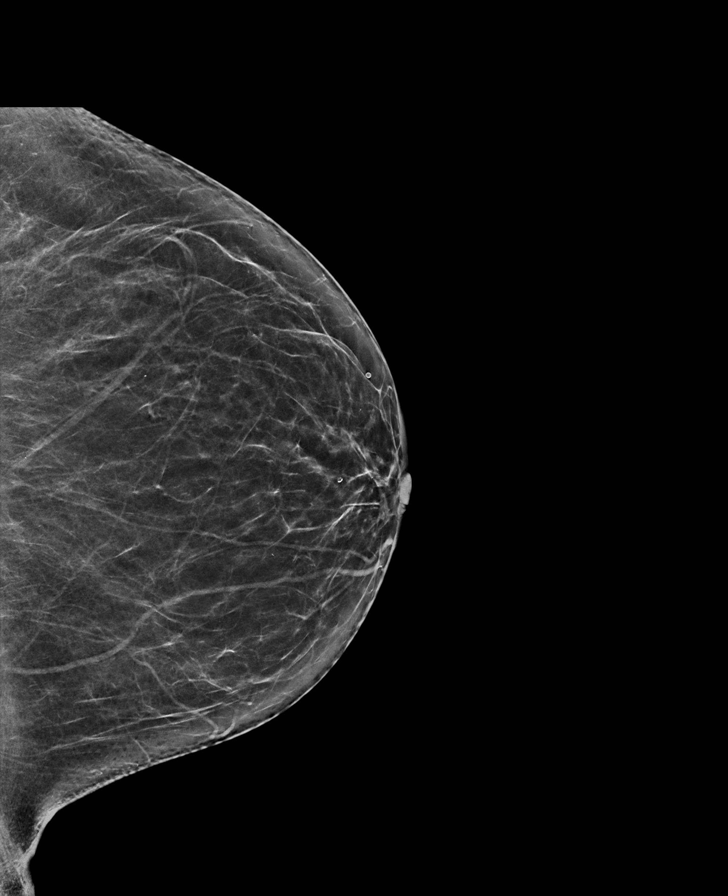

[R MLO synth-2D]
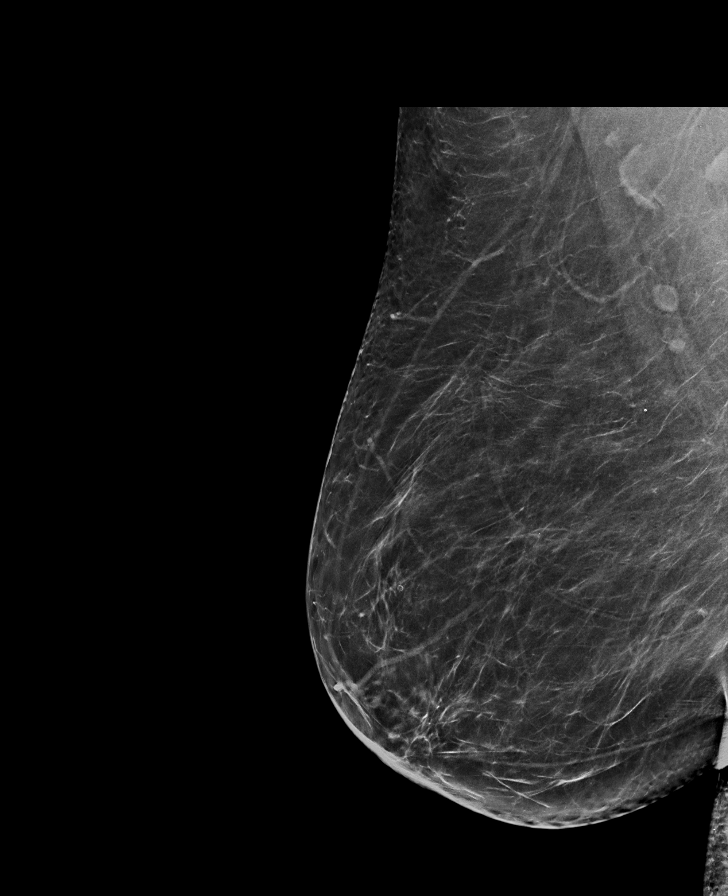

[R MLO tomo · tomo slice 41/80.0]
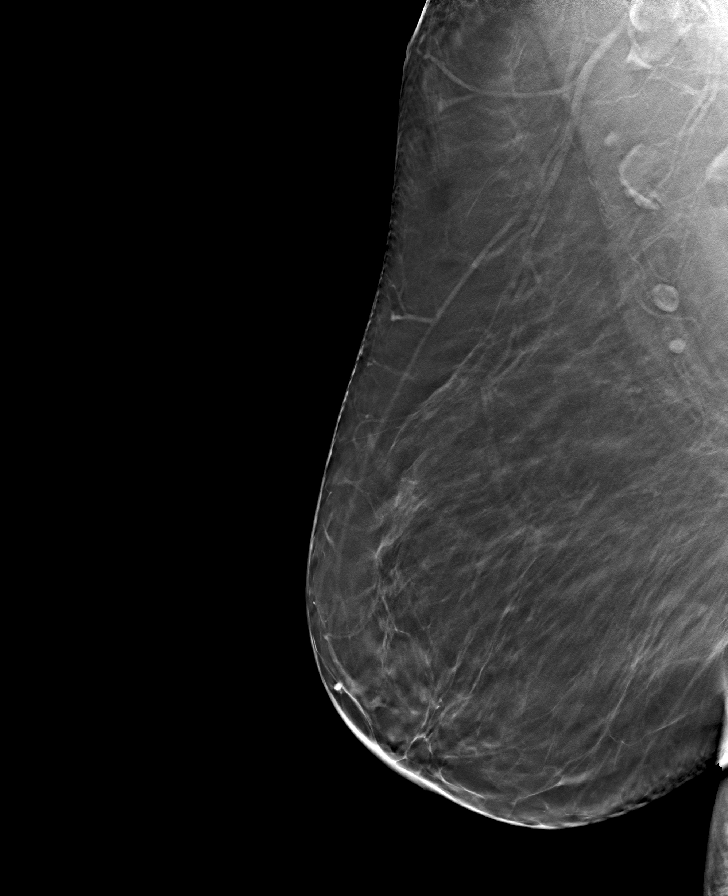

[L CC tomo · tomo slice 33/65.0]
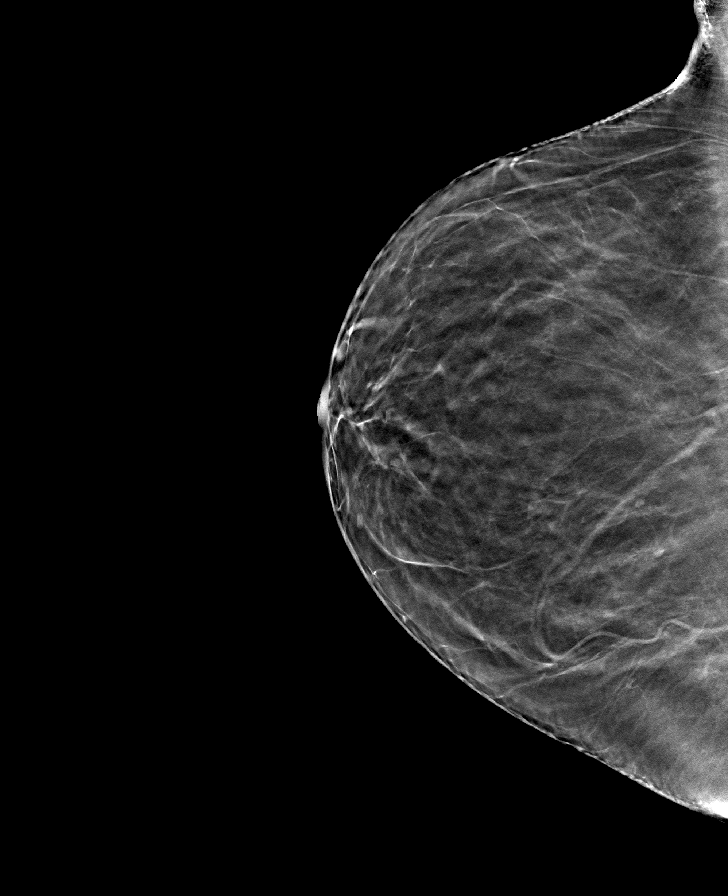

[L MLO tomo · tomo slice 39/76.0]
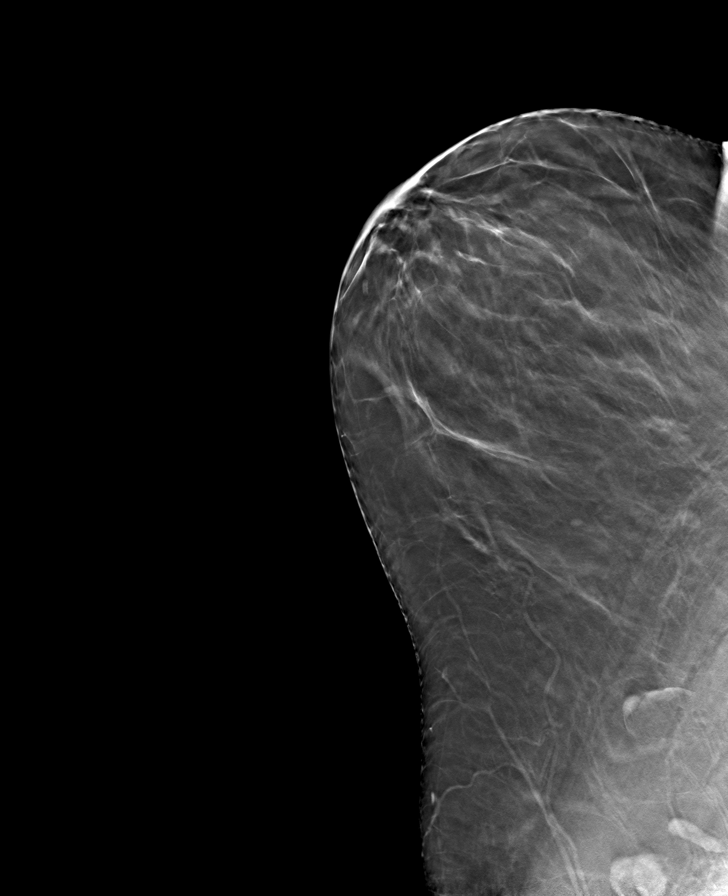

[R CC tomo · tomo slice 34/67.0]
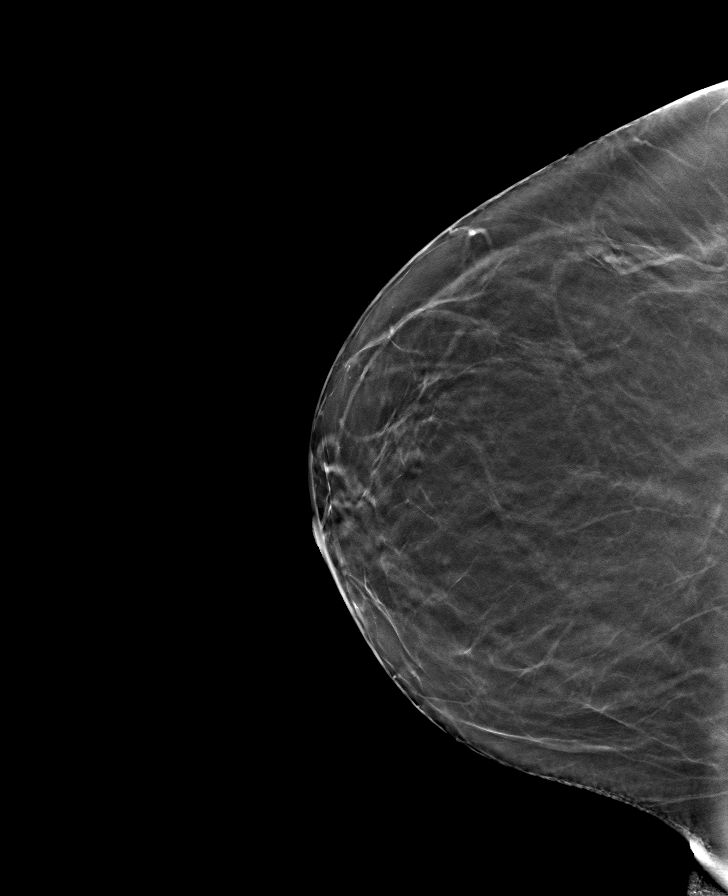

[8 of 24 positions shown; findings below may reference images not displayed]

ACR Breast Density Category b: There are scattered areas of
fibroglandular density.
FINDINGS: There are no findings suspicious for malignancy.
IMPRESSION: No mammographic evidence of malignancy. A result letter of this
screening mammogram will be mailed directly to the patient.

RECOMMENDATION:
Screening mammogram in one year. (Code:51-O-LD2)

BI-RADS CATEGORY  1: Negative.
